# Patient Record
Sex: Male | Born: 1995 | Hispanic: No | Marital: Single | Smoking: Never smoker
Health system: Southern US, Community
[De-identification: ages and names within clinical notes are randomized; demographics above are authoritative.]

## PROBLEM LIST (undated history)

## (undated) DIAGNOSIS — Z8669 Personal history of other diseases of the nervous system and sense organs: Secondary | ICD-10-CM

## (undated) DIAGNOSIS — F988 Other specified behavioral and emotional disorders with onset usually occurring in childhood and adolescence: Secondary | ICD-10-CM

## (undated) DIAGNOSIS — R4681 Obsessive-compulsive behavior: Secondary | ICD-10-CM

## (undated) DIAGNOSIS — E669 Obesity, unspecified: Secondary | ICD-10-CM

## (undated) DIAGNOSIS — F840 Autistic disorder: Secondary | ICD-10-CM

## (undated) DIAGNOSIS — F411 Generalized anxiety disorder: Secondary | ICD-10-CM

## (undated) DIAGNOSIS — G479 Sleep disorder, unspecified: Secondary | ICD-10-CM

## (undated) HISTORY — DX: Generalized anxiety disorder: F41.1

## (undated) HISTORY — DX: Sleep disorder, unspecified: G47.9

## (undated) HISTORY — DX: Obsessive-compulsive behavior: R46.81

## (undated) HISTORY — DX: Obesity, unspecified: E66.9

## (undated) HISTORY — DX: Autistic disorder: F84.0

## (undated) HISTORY — PX: TYMPANOSTOMY TUBE PLACEMENT: SHX32

## (undated) HISTORY — DX: Other specified behavioral and emotional disorders with onset usually occurring in childhood and adolescence: F98.8

## (undated) HISTORY — DX: Personal history of other diseases of the nervous system and sense organs: Z86.69

---
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
## 2013-01-04 ENCOUNTER — Other Ambulatory Visit: Payer: Self-pay | Admitting: *Deleted

## 2013-01-04 MED ORDER — AMPHETAMINE-DEXTROAMPHETAMINE 30 MG PO TABS: 30.0000 mg | ORAL_TABLET | Freq: Three times a day (TID) | ORAL | Status: DC

## 2013-01-17 ENCOUNTER — Telehealth: Payer: Self-pay | Admitting: *Deleted

## 2013-01-17 NOTE — Telephone Encounter (Signed)
Ok.  Sounds good.

## 2013-01-17 NOTE — Telephone Encounter (Signed)
Pt is going out of town & will run out of his Adderall while he is gone so I gave a verbal approval for this to be filled 5 days early.  I also told the pharmacist to let the mother know again that no more refills will be given without an appt with a new physician since Thurmond Butts is no longer here.

## 2013-01-30 ENCOUNTER — Other Ambulatory Visit: Payer: Self-pay

## 2013-01-30 MED ORDER — AMPHETAMINE-DEXTROAMPHETAMINE 30 MG PO TABS: 30.0000 mg | ORAL_TABLET | Freq: Three times a day (TID) | ORAL | Status: DC

## 2013-01-31 ENCOUNTER — Other Ambulatory Visit: Payer: Self-pay | Admitting: Family Medicine

## 2013-02-01 ENCOUNTER — Other Ambulatory Visit: Payer: Self-pay | Admitting: *Deleted

## 2013-02-01 MED ORDER — CLONIDINE HCL 0.2 MG PO TABS: 0.2000 mg | ORAL_TABLET | Freq: Every day | ORAL | Status: DC

## 2013-02-08 ENCOUNTER — Ambulatory Visit: Payer: BC Managed Care – PPO | Admitting: Physician Assistant

## 2013-02-22 ENCOUNTER — Encounter: Payer: Self-pay | Admitting: Physician Assistant

## 2013-02-22 ENCOUNTER — Ambulatory Visit (INDEPENDENT_AMBULATORY_CARE_PROVIDER_SITE_OTHER): Payer: BC Managed Care – PPO | Admitting: Physician Assistant

## 2013-02-22 VITALS — BP 131/76 | HR 85 | Wt 268.0 lb

## 2013-02-22 DIAGNOSIS — G4700 Insomnia, unspecified: Secondary | ICD-10-CM

## 2013-02-22 DIAGNOSIS — F909 Attention-deficit hyperactivity disorder, unspecified type: Secondary | ICD-10-CM

## 2013-02-22 DIAGNOSIS — F840 Autistic disorder: Secondary | ICD-10-CM

## 2013-02-22 MED ORDER — AMPHETAMINE-DEXTROAMPHETAMINE 30 MG PO TABS: ORAL_TABLET | ORAL | Status: DC

## 2013-02-22 MED ORDER — CLONIDINE HCL 0.2 MG PO TABS: 0.2000 mg | ORAL_TABLET | Freq: Every day | ORAL | Status: DC

## 2013-02-22 NOTE — Patient Instructions (Addendum)
Follow up in 3 months

## 2013-02-23 ENCOUNTER — Telehealth: Payer: Self-pay

## 2013-02-23 NOTE — Telephone Encounter (Signed)
[facility] wants to confirm the dosing on his clonidine. Should it be take one 0.2 mg tablet at bedtime or take four 0.2 mg tablets at bedtime?

## 2013-02-23 NOTE — Progress Notes (Signed)
  Subjective:    Patient ID: Cody Harvey, male    DOB: 09-Sep-1996, 17 y.o.   MRN: [id number]  HPI Patient presents to the clinic with his father for ADHD and Insomina. Pt does have autism but is doing very well. He is currently doing online school and making A's and B's.  He is very stable on dose of adderall. He does have sleep issues that may be in part a side effect from meds but has been on clonadine and adderall for over a year together. He takes 1-4 tabs of clonadine at night to help him sleep. Denies any CP, palpitations, SOB, anxiety. No complaints or concerns today.   Review of Systems     Objective:   Physical Exam  Constitutional: He is oriented to person, place, and time. He appears well-developed and well-nourished.  HENT:  Head: Normocephalic and atraumatic.  Cardiovascular: Normal rate, regular rhythm and normal heart sounds.   Neurological: He is alert and oriented to person, place, and time.  Skin: Skin is warm and dry.  Psychiatric: He has a normal mood and affect. His behavior is normal.          Assessment & Plan:  insomina- refilled clonadine. Encouraged good bedtime routine. No TV in room. Calming activities at night.  ADHD- printed 3 months of adderall rx. Follow up in 3 months.

## 2013-02-23 NOTE — Telephone Encounter (Signed)
Up to 4 at bedtime was the dose he has been on in the past.

## 2013-02-24 NOTE — Telephone Encounter (Signed)
[facility] advised.

## 2013-05-26 ENCOUNTER — Encounter: Payer: Self-pay | Admitting: Physician Assistant

## 2013-05-26 ENCOUNTER — Ambulatory Visit (INDEPENDENT_AMBULATORY_CARE_PROVIDER_SITE_OTHER): Payer: BC Managed Care – PPO | Admitting: Physician Assistant

## 2013-05-26 VITALS — BP 122/72 | HR 79 | Temp 98.0°F | Wt 269.0 lb

## 2013-05-26 DIAGNOSIS — J209 Acute bronchitis, unspecified: Secondary | ICD-10-CM

## 2013-05-26 MED ORDER — HYDROCOD POLST-CHLORPHEN POLST 10-8 MG/5ML PO LQCR: 5.0000 mL | Freq: Two times a day (BID) | ORAL | Status: DC | PRN

## 2013-05-26 MED ORDER — AZITHROMYCIN 250 MG PO TABS: ORAL_TABLET | ORAL | Status: DC

## 2013-05-26 NOTE — Patient Instructions (Signed)

## 2013-05-26 NOTE — Progress Notes (Signed)
  Subjective:    Patient ID: Cody Harvey, male    DOB: May 23, 1996, 17 y.o.   MRN: [id number]  HPI Patient is 17 yo male who is autistic and presents with his mother to clinic. He has had a productive cough that has been worsening for the last week. Cough is worse in the morning and at night. Denies fever, chills, sinus pressure, ear pain, or ST. He does not have history of asthma. He has been taking delsym and helps some. Sputum is green. He does feel like his chest is starting to feel tight. Pt wants to feel better for all his graduation functions this weekend. He would like cough syrup given by Dr. Thurmond Butts.    Review of Systems     Objective:   Physical Exam  Constitutional: He is oriented to person, place, and time. He appears well-developed and well-nourished.  HENT:  Head: Normocephalic and atraumatic.  Right Ear: External ear normal.  Left Ear: External ear normal.  Nose: Nose normal.  Mouth/Throat: Oropharynx is clear and moist.  Eyes: Conjunctivae are normal.  Neck: Normal range of motion. Neck supple.  Cardiovascular: Normal rate, regular rhythm and normal heart sounds.   Pulmonary/Chest: Effort normal.  Mild wheezing in both lungs.  Lymphadenopathy:    He has no cervical adenopathy.  Neurological: He is alert and oriented to person, place, and time.  Skin: Skin is warm and dry.  Psychiatric: He has a normal mood and affect. His behavior is normal.          Assessment & Plan:  Bronchitis- Pt has a lot of graduation parties and functions this week. Will treat with zpak and tussinonex which he has used before. Continue to use delsym during the day. Mucinex could also help. Call if not improving.

## 2013-05-31 ENCOUNTER — Ambulatory Visit (INDEPENDENT_AMBULATORY_CARE_PROVIDER_SITE_OTHER): Payer: BC Managed Care – PPO | Admitting: Physician Assistant

## 2013-05-31 ENCOUNTER — Encounter: Payer: Self-pay | Admitting: Physician Assistant

## 2013-05-31 VITALS — BP 116/75 | HR 89 | Wt 271.0 lb

## 2013-05-31 DIAGNOSIS — E669 Obesity, unspecified: Secondary | ICD-10-CM

## 2013-05-31 DIAGNOSIS — Z79899 Other long term (current) drug therapy: Secondary | ICD-10-CM

## 2013-05-31 DIAGNOSIS — F909 Attention-deficit hyperactivity disorder, unspecified type: Secondary | ICD-10-CM

## 2013-05-31 MED ORDER — AMPHETAMINE-DEXTROAMPHETAMINE 30 MG PO TABS: ORAL_TABLET | ORAL | Status: DC

## 2013-06-01 LAB — COMPREHENSIVE METABOLIC PANEL
ALT: 17 U/L (ref 0–53)
AST: 18 U/L (ref 0–37)
Alkaline Phosphatase: 69 U/L (ref 39–117)
CO2: 29 mEq/L (ref 19–32)
Creat: 0.75 mg/dL (ref 0.50–1.35)
Total Bilirubin: 0.5 mg/dL (ref 0.3–1.2)

## 2013-06-01 NOTE — Progress Notes (Signed)
  Subjective:    Patient ID: Cody Harvey, male    DOB: [birth year], 17 y.o.   MRN: [id number]  HPI Patient presents to the clinic with his father to follow up on ADHD. He is doing great today. He graduated from high school last weekend. He made all A's this year. He is considering all of his college options. Denies any CP, palpitations. He has chronic insomnia but well controlled currently on clonidine at night. No concerns today.    Review of Systems     Objective:   Physical Exam  Constitutional: He is oriented to person, place, and time. He appears well-developed and well-nourished.  obese  HENT:  Head: Normocephalic and atraumatic.  Cardiovascular: Normal rate, regular rhythm and normal heart sounds.   Pulmonary/Chest: Effort normal and breath sounds normal. He has no wheezes.  Neurological: He is alert and oriented to person, place, and time.  Skin: Skin is warm and dry.  Psychiatric: He has a normal mood and affect. His behavior is normal.          Assessment & Plan:  ADHD- Refilled meds for 4 months. CMP checked today.  Obesity- encouraged pt to stay active to combat weight gain. Discussed getting outside daily and importance of cardio.

## 2013-07-03 ENCOUNTER — Other Ambulatory Visit: Payer: Self-pay | Admitting: *Deleted

## 2013-07-03 MED ORDER — CLONIDINE HCL 0.2 MG PO TABS: 0.2000 mg | ORAL_TABLET | Freq: Every day | ORAL | Status: DC

## 2013-08-09 IMAGING — CR DG WRIST COMPLETE 3+V*L*
2 series · 2 of 2 positions shown · non-contrast
Comparison: None

CLINICAL DATA: The wrist pain.  No known injury.

LEFT WRIST - COMPLETE 3+ VIEW

[view not recorded (1 of 2)]
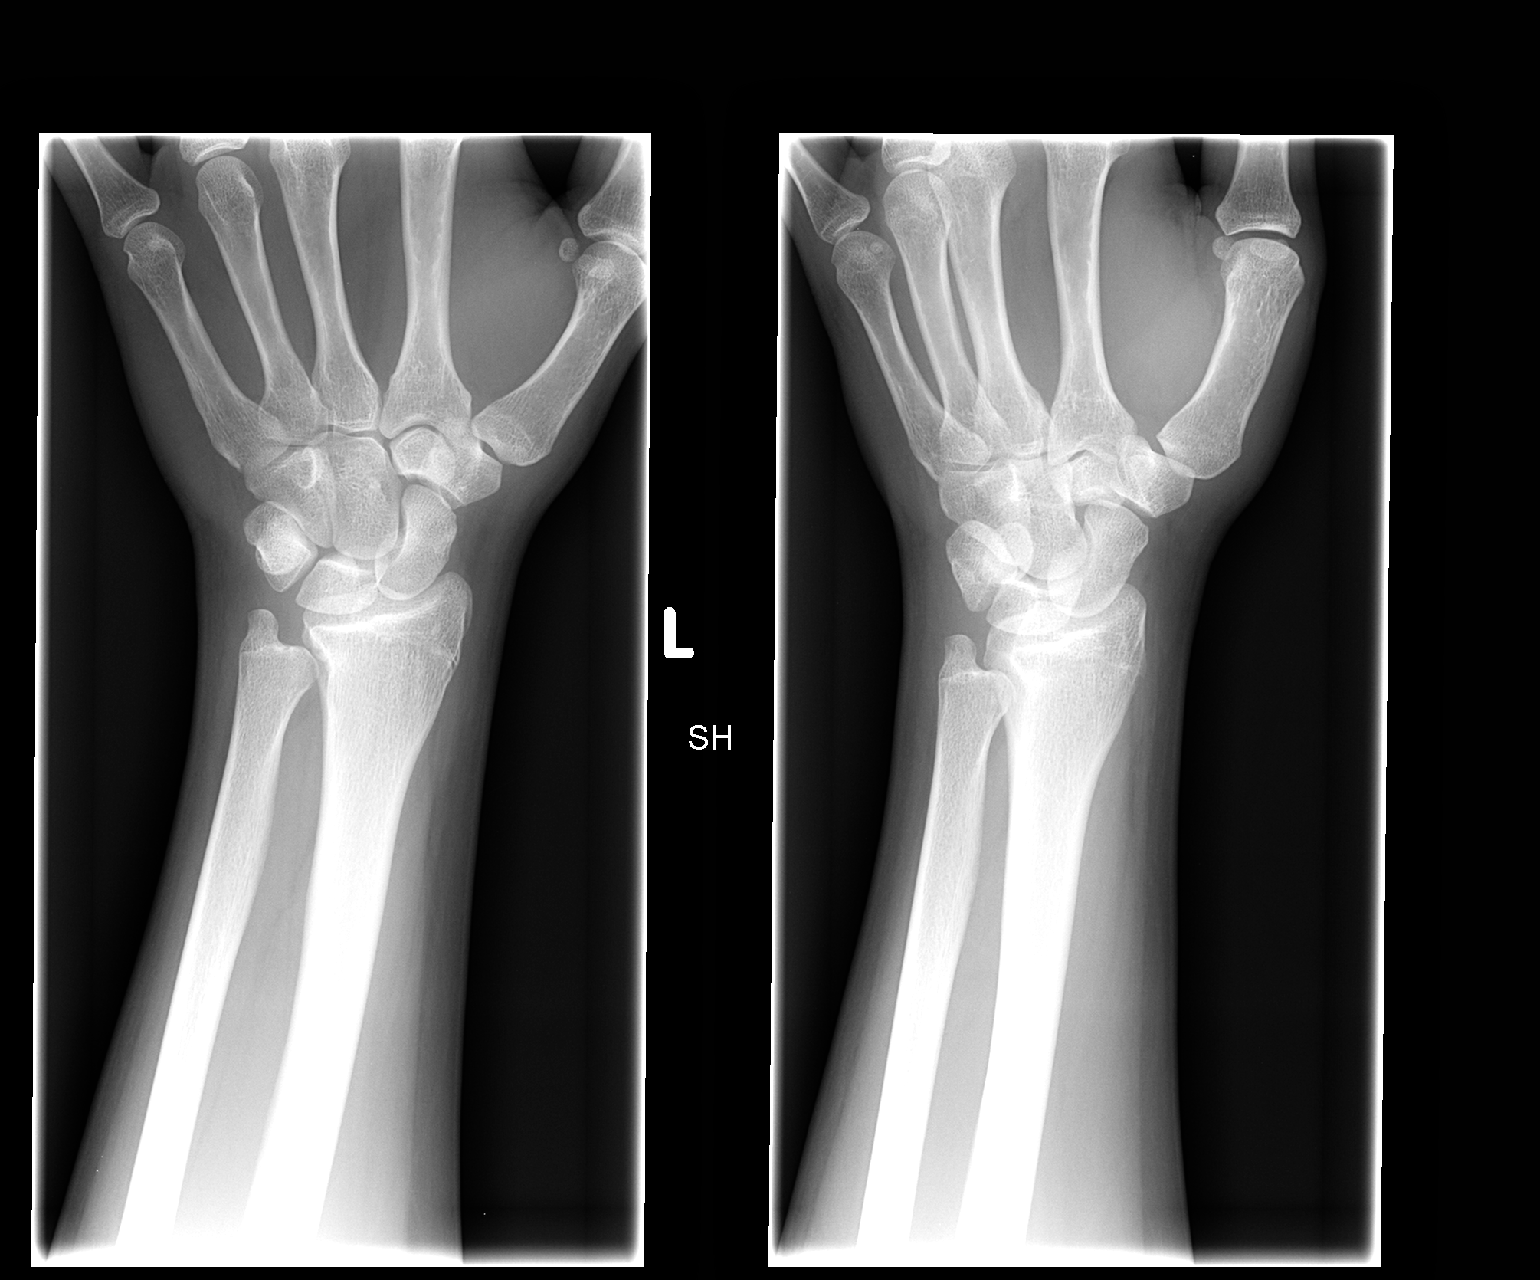

[view not recorded (2 of 2)]
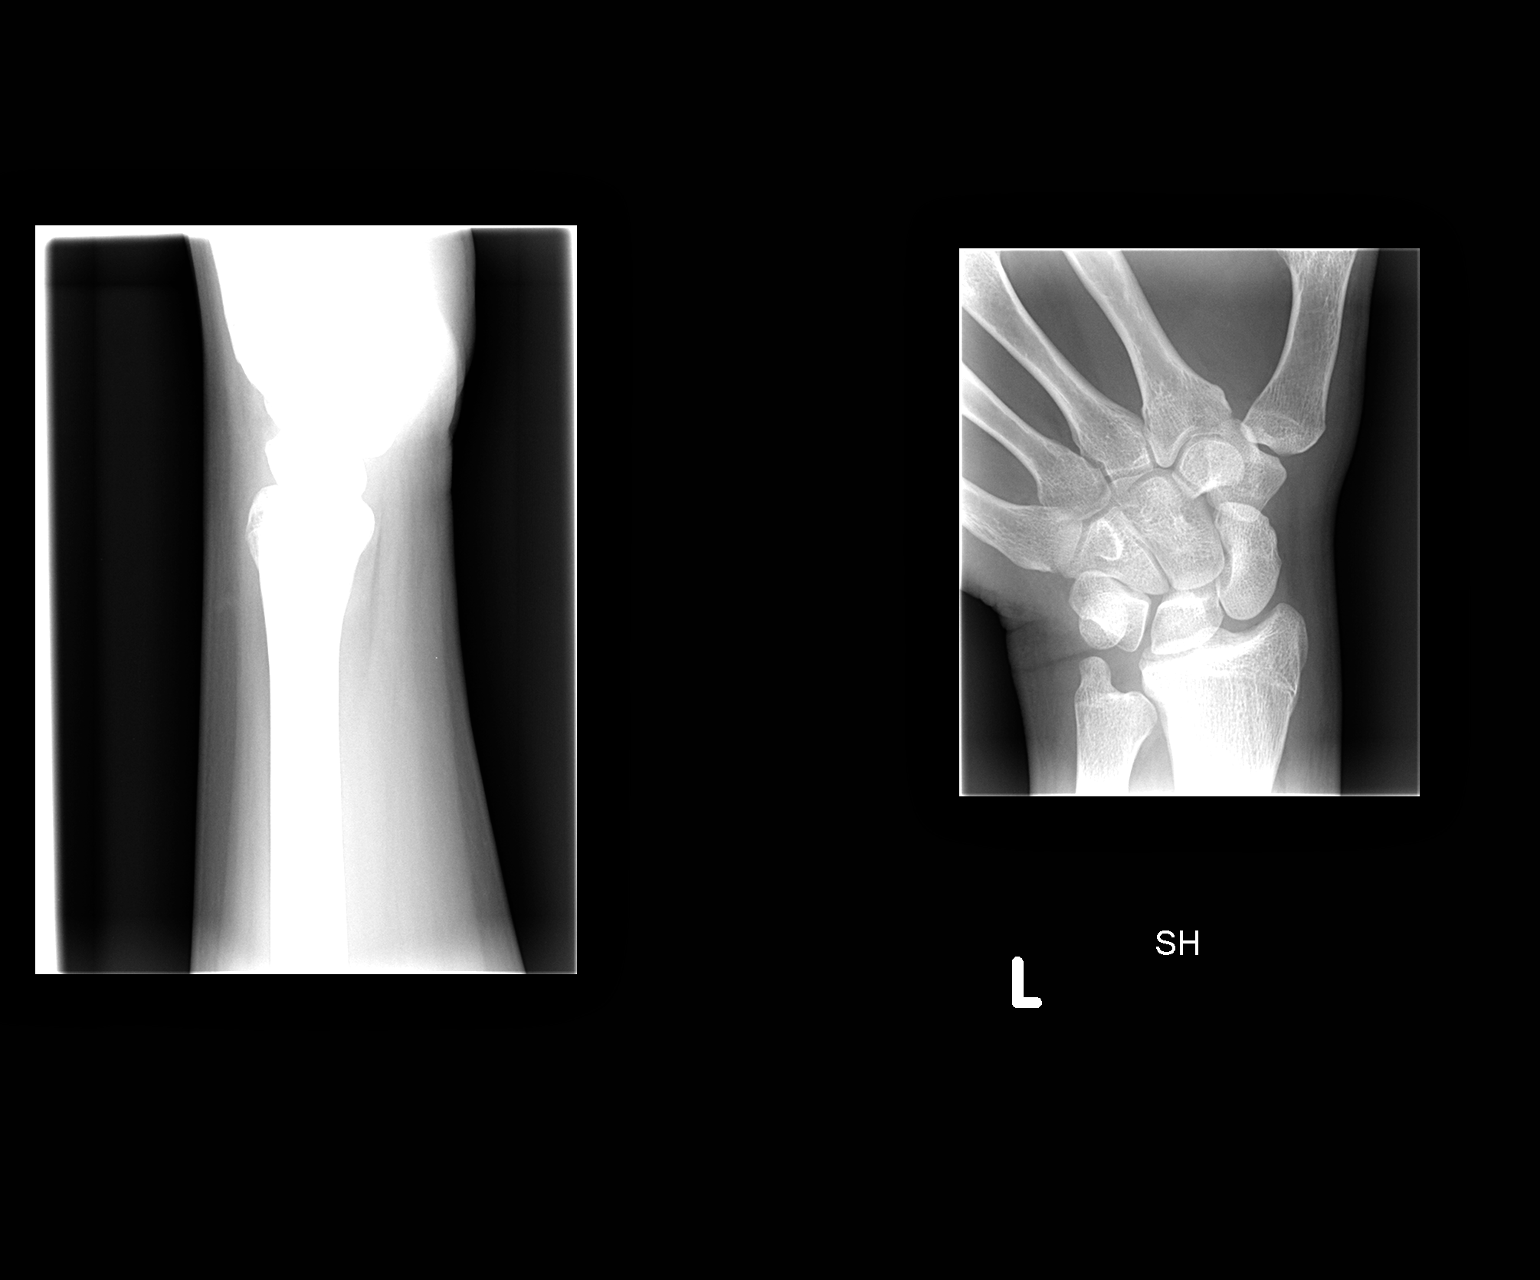

[2 of 2 positions shown; findings below may reference images not displayed]

FINDINGS: The joint spaces are maintained.  Negative ulnar variance
is noted.  Near complete fusion of the radial physis.  No acute
fracture.  No bone lesions or erosions.
IMPRESSION: 1.  Negative ulnar variance.
2.  No acute bony findings.

## 2013-10-16 ENCOUNTER — Other Ambulatory Visit: Payer: Self-pay | Admitting: Physician Assistant

## 2013-11-15 ENCOUNTER — Other Ambulatory Visit: Payer: Self-pay | Admitting: Physician Assistant

## 2013-11-22 ENCOUNTER — Ambulatory Visit (INDEPENDENT_AMBULATORY_CARE_PROVIDER_SITE_OTHER): Payer: BC Managed Care – PPO | Admitting: Physician Assistant

## 2013-11-22 ENCOUNTER — Encounter: Payer: Self-pay | Admitting: Physician Assistant

## 2013-11-22 VITALS — BP 124/71 | HR 93 | Wt 268.0 lb

## 2013-11-22 DIAGNOSIS — F840 Autistic disorder: Secondary | ICD-10-CM

## 2013-11-22 DIAGNOSIS — G4700 Insomnia, unspecified: Secondary | ICD-10-CM

## 2013-11-22 DIAGNOSIS — Z23 Encounter for immunization: Secondary | ICD-10-CM

## 2013-11-22 DIAGNOSIS — F909 Attention-deficit hyperactivity disorder, unspecified type: Secondary | ICD-10-CM

## 2013-11-22 MED ORDER — AMPHETAMINE-DEXTROAMPHETAMINE 30 MG PO TABS: ORAL_TABLET | ORAL | Status: DC

## 2013-11-22 MED ORDER — CLONIDINE HCL 0.2 MG PO TABS: ORAL_TABLET | ORAL | Status: DC

## 2013-11-22 NOTE — Progress Notes (Signed)
   Subjective:    Patient ID: Cody Harvey, male    DOB: 1995-12-19, 17 y.o.   MRN: [id number]  HPI Pt presents to the clinic for follow up and medication refill.   ADHD- Pt taking college classes online. He is doing well with dose. Denies any palpitations or anorexia. He has chronic insomia and takes clonadine every night. No problems today.    Review of Systems     Objective:   Physical Exam  Constitutional: He is oriented to person, place, and time. He appears well-developed and well-nourished.  Obese.   HENT:  Head: Normocephalic and atraumatic.  Cardiovascular: Normal rate, regular rhythm and normal heart sounds.   Pulmonary/Chest: Effort normal and breath sounds normal.  Neurological: He is alert and oriented to person, place, and time.  Skin: Skin is warm and dry.  Psychiatric: He has a normal mood and affect. His behavior is normal.          Assessment & Plan:  ADHd-refilled adderall for 3 months.   Insomnia-refilled clonadine for 3 months.   Autism- unchanged and coping well.   Flu shot given without complications.

## 2014-01-23 ENCOUNTER — Other Ambulatory Visit: Payer: Self-pay | Admitting: Physician Assistant

## 2014-02-17 ENCOUNTER — Other Ambulatory Visit: Payer: Self-pay | Admitting: Physician Assistant

## 2014-03-20 ENCOUNTER — Telehealth: Payer: Self-pay | Admitting: *Deleted

## 2014-03-20 ENCOUNTER — Other Ambulatory Visit: Payer: Self-pay | Admitting: Physician Assistant

## 2014-03-20 MED ORDER — CLONIDINE HCL 0.2 MG PO TABS: ORAL_TABLET | ORAL | Status: DC

## 2014-03-20 NOTE — Telephone Encounter (Signed)
Mom left vm stating that Cody Harvey's clonadine had been denied.  She stated that she was under the impression that he has been seen every 6 months for that not 3 months like the adderall.  She states that he only has one pill left & wants to know if you need him to come in tomorrow.  Please advise.

## 2014-03-20 NOTE — Telephone Encounter (Signed)
Refill sent.  Pt's mom notified.

## 2014-03-20 NOTE — Telephone Encounter (Signed)
Amber please give refill plus one for clonadine. Follow up in June.

## 2014-04-17 ENCOUNTER — Ambulatory Visit (INDEPENDENT_AMBULATORY_CARE_PROVIDER_SITE_OTHER): Payer: BC Managed Care – PPO | Admitting: Physician Assistant

## 2014-04-17 ENCOUNTER — Encounter: Payer: Self-pay | Admitting: Physician Assistant

## 2014-04-17 VITALS — BP 100/62 | HR 83 | Wt 264.0 lb

## 2014-04-17 DIAGNOSIS — G4700 Insomnia, unspecified: Secondary | ICD-10-CM

## 2014-04-17 DIAGNOSIS — F909 Attention-deficit hyperactivity disorder, unspecified type: Secondary | ICD-10-CM

## 2014-04-17 DIAGNOSIS — E663 Overweight: Secondary | ICD-10-CM

## 2014-04-17 DIAGNOSIS — F840 Autistic disorder: Secondary | ICD-10-CM

## 2014-04-17 MED ORDER — AMPHETAMINE-DEXTROAMPHETAMINE 30 MG PO TABS: ORAL_TABLET | ORAL | Status: DC

## 2014-04-17 MED ORDER — CLONIDINE HCL 0.2 MG PO TABS: ORAL_TABLET | ORAL | Status: DC

## 2014-04-17 NOTE — Progress Notes (Signed)
   Subjective:    Patient ID: Cody Harvey, male    DOB: 23-Aug-1996, 18 y.o.   MRN: [id number]  HPI Patient is a 18 year old male who presents to the clinic to followup on ADHD as well as insomnia. He is on Adderall and clonidine. He is doing well on this medication and has been for a few years now. He has no complaints, problems or concerns. He has been diagnosed with autism. He stays at home with his mother and father. She is currently doing some home on college classes which are very enjoyable.   Review of Systems     Objective:   Physical Exam  Constitutional: He is oriented to person, place, and time. He appears well-developed and well-nourished.  Obesity  HENT:  Head: Normocephalic and atraumatic.  Cardiovascular: Normal rate, regular rhythm and normal heart sounds.   Pulmonary/Chest: Effort normal and breath sounds normal.  Neurological: He is alert and oriented to person, place, and time.  Skin: Skin is dry.  Psychiatric: He has a normal mood and affect. His behavior is normal.          Assessment & Plan:  ADHD-refilled Adderall today. Patient is very controlled and okay for followup in 6 months.  Insomnia-refilled clonidine. Patient is very controlled refill in 6 months.  Obesity-patient kindly encouraged to stay active and maintain a healthy diet. Concerned about his weight and future health problems.  Has been a while since we checked kidney and liver function. CMP was ordered today.

## 2014-08-29 ENCOUNTER — Encounter: Payer: Self-pay | Admitting: Physician Assistant

## 2014-08-29 ENCOUNTER — Ambulatory Visit (INDEPENDENT_AMBULATORY_CARE_PROVIDER_SITE_OTHER): Payer: BLUE CROSS/BLUE SHIELD | Admitting: Physician Assistant

## 2014-08-29 VITALS — BP 131/70 | HR 85 | Ht 73.0 in | Wt 258.0 lb

## 2014-08-29 DIAGNOSIS — F909 Attention-deficit hyperactivity disorder, unspecified type: Secondary | ICD-10-CM | POA: Diagnosis not present

## 2014-08-29 DIAGNOSIS — Z13220 Encounter for screening for lipoid disorders: Secondary | ICD-10-CM

## 2014-08-29 DIAGNOSIS — F900 Attention-deficit hyperactivity disorder, predominantly inattentive type: Secondary | ICD-10-CM

## 2014-08-29 DIAGNOSIS — G4700 Insomnia, unspecified: Secondary | ICD-10-CM

## 2014-08-29 MED ORDER — CLONIDINE HCL 0.2 MG PO TABS: ORAL_TABLET | ORAL | Status: DC

## 2014-08-29 MED ORDER — AMPHETAMINE-DEXTROAMPHETAMINE 30 MG PO TABS: ORAL_TABLET | ORAL | Status: DC

## 2014-08-31 NOTE — Progress Notes (Signed)
   Subjective:    Patient ID: Cody Harvey, male    DOB: 1996/09/03, 18 y.o.   MRN: [id number]  HPI Pt presents to the clinic with mother and sister for medication refill.   ADHD- doing well using adderall as needed. Taking college classes and does not need it every day. No problems or concerns with medication.   Insomnia- controlled for years on clonidine. No problems.    Review of Systems  All other systems reviewed and are negative.      Objective:   Physical Exam  Constitutional: He is oriented to person, place, and time. He appears well-developed and well-nourished.  Obese.   HENT:  Head: Normocephalic and atraumatic.  Cardiovascular: Normal rate, regular rhythm and normal heart sounds.   Pulmonary/Chest: Effort normal and breath sounds normal.  Neurological: He is alert and oriented to person, place, and time.  Psychiatric: He has a normal mood and affect. His behavior is normal.          Assessment & Plan:  ADHD- refilled for 3 months.   Insomnia- refilled for 3 months.   Pt given lipid and cmp to have drawn fasting. Encouraged next visit to be cPE.   Brief discussion on weight.   Pt declined flu shot today. Reports will come back later in season to get.

## 2014-09-21 LAB — COMPLETE METABOLIC PANEL WITH GFR
ALBUMIN: 4.6 g/dL (ref 3.5–5.2)
ALT: 14 U/L (ref 0–53)
AST: 17 U/L (ref 0–37)
Alkaline Phosphatase: 57 U/L (ref 39–117)
BUN: 13 mg/dL (ref 6–23)
CHLORIDE: 99 meq/L (ref 96–112)
CO2: 24 mEq/L (ref 19–32)
Calcium: 9.6 mg/dL (ref 8.4–10.5)
Creat: 0.85 mg/dL (ref 0.50–1.35)
GFR, Est African American: >89 mL/min
GFR, Est Non African American: >89 mL/min
Glucose, Bld: 75 mg/dL (ref 70–99)
POTASSIUM: 4.1 meq/L (ref 3.5–5.3)
Sodium: 136 mEq/L (ref 135–145)
TOTAL PROTEIN: 7 g/dL (ref 6.0–8.3)
Total Bilirubin: 0.6 mg/dL (ref 0.2–1.2)

## 2014-09-21 LAB — LIPID PANEL
CHOL/HDL RATIO: 4.7 ratio
Cholesterol: 175 mg/dL (ref 0–200)
HDL: 37 mg/dL — ABNORMAL LOW (ref >39)
LDL Cholesterol: 115 mg/dL — ABNORMAL HIGH (ref 0–99)
TRIGLYCERIDES: 114 mg/dL (ref <150)
VLDL: 23 mg/dL (ref 0–40)

## 2015-01-07 ENCOUNTER — Telehealth: Payer: Self-pay

## 2015-01-07 NOTE — Telephone Encounter (Signed)
Ok for 1 month they have been well controlled for many months.

## 2015-01-07 NOTE — Telephone Encounter (Signed)
Mom would like to have a month refill on adderall and she will bring him in for a follow up in about a month.

## 2015-01-08 MED ORDER — AMPHETAMINE-DEXTROAMPHETAMINE 30 MG PO TABS
ORAL_TABLET | ORAL | Status: DC
Start: 2015-01-08 — End: 2015-03-04

## 2015-01-08 NOTE — Telephone Encounter (Signed)
Mom is aware to pick up refill at front desk.

## 2015-03-04 ENCOUNTER — Other Ambulatory Visit: Payer: Self-pay | Admitting: *Deleted

## 2015-03-04 MED ORDER — AMPHETAMINE-DEXTROAMPHETAMINE 30 MG PO TABS: ORAL_TABLET | ORAL | Status: DC

## 2015-03-06 ENCOUNTER — Other Ambulatory Visit: Payer: Self-pay | Admitting: Physician Assistant

## 2015-03-27 ENCOUNTER — Ambulatory Visit (INDEPENDENT_AMBULATORY_CARE_PROVIDER_SITE_OTHER): Payer: BLUE CROSS/BLUE SHIELD | Admitting: Physician Assistant

## 2015-03-27 ENCOUNTER — Encounter: Payer: Self-pay | Admitting: Physician Assistant

## 2015-03-27 VITALS — BP 118/69 | HR 87 | Wt 265.0 lb

## 2015-03-27 DIAGNOSIS — G4700 Insomnia, unspecified: Secondary | ICD-10-CM

## 2015-03-27 DIAGNOSIS — E669 Obesity, unspecified: Secondary | ICD-10-CM

## 2015-03-27 DIAGNOSIS — F900 Attention-deficit hyperactivity disorder, predominantly inattentive type: Secondary | ICD-10-CM | POA: Diagnosis not present

## 2015-03-27 DIAGNOSIS — F840 Autistic disorder: Secondary | ICD-10-CM | POA: Diagnosis not present

## 2015-03-27 DIAGNOSIS — H6123 Impacted cerumen, bilateral: Secondary | ICD-10-CM

## 2015-03-27 MED ORDER — AMPHETAMINE-DEXTROAMPHETAMINE 30 MG PO TABS: ORAL_TABLET | ORAL | Status: DC

## 2015-03-27 MED ORDER — CLONIDINE HCL 0.2 MG PO TABS: ORAL_TABLET | ORAL | Status: DC

## 2015-03-28 DIAGNOSIS — E669 Obesity, unspecified: Secondary | ICD-10-CM | POA: Insufficient documentation

## 2015-03-28 NOTE — Progress Notes (Signed)
   Subjective:    Patient ID: Cody Harvey, male    DOB: October 06, 1996, 19 y.o.   MRN: [id number]  HPI  Pt presents to the clinic for medication refill.   ADHD- doing well on medication. Does not take everyday. He is currently taking college classes. No side effects. Tolerated for some time.   Insomnia- doing well as long as uses medication. No SE or issues.     Review of Systems  All other systems reviewed and are negative.      Objective:   Physical Exam  Constitutional: He is oriented to person, place, and time. He appears well-developed and well-nourished.  Obese.   HENT:  Head: Normocephalic and atraumatic.  Bilateral cerumen impacted.  TM's clear after irrigation.   Eyes: Conjunctivae are normal. Right eye exhibits no discharge. Left eye exhibits no discharge.  Cardiovascular: Normal rate, regular rhythm and normal heart sounds.   Pulmonary/Chest: Effort normal and breath sounds normal. He has no wheezes.  Neurological: He is alert and oriented to person, place, and time.  Psychiatric: He has a normal mood and affect. His behavior is normal.          Assessment & Plan:  ADHD- refilled for 4 months to get them until I get back from maternity leave.   Insomnia- 6 months refilled.   Cerumen impaction- ear irrigated today.   Obesity- dicussed regular exercise, diet control, and weight control. Last LDL was 09/2014 and looked good.

## 2015-09-18 ENCOUNTER — Other Ambulatory Visit: Payer: Self-pay | Admitting: Physician Assistant

## 2015-10-09 ENCOUNTER — Other Ambulatory Visit: Payer: Self-pay | Admitting: *Deleted

## 2015-10-09 MED ORDER — AMPHETAMINE-DEXTROAMPHETAMINE 30 MG PO TABS: ORAL_TABLET | ORAL | Status: DC

## 2015-10-09 MED ORDER — CLONIDINE HCL 0.2 MG PO TABS: 0.8000 mg | ORAL_TABLET | Freq: Every day | ORAL | Status: DC

## 2015-10-23 ENCOUNTER — Ambulatory Visit: Payer: BLUE CROSS/BLUE SHIELD | Admitting: Physician Assistant

## 2015-11-05 ENCOUNTER — Ambulatory Visit: Payer: BLUE CROSS/BLUE SHIELD | Admitting: Physician Assistant

## 2015-11-06 ENCOUNTER — Encounter: Payer: Self-pay | Admitting: Physician Assistant

## 2015-11-06 ENCOUNTER — Ambulatory Visit (INDEPENDENT_AMBULATORY_CARE_PROVIDER_SITE_OTHER): Payer: BLUE CROSS/BLUE SHIELD | Admitting: Physician Assistant

## 2015-11-06 VITALS — BP 120/57 | HR 92 | Ht 73.0 in | Wt 269.0 lb

## 2015-11-06 DIAGNOSIS — Z23 Encounter for immunization: Secondary | ICD-10-CM

## 2015-11-06 DIAGNOSIS — G4700 Insomnia, unspecified: Secondary | ICD-10-CM | POA: Diagnosis not present

## 2015-11-06 DIAGNOSIS — F900 Attention-deficit hyperactivity disorder, predominantly inattentive type: Secondary | ICD-10-CM | POA: Diagnosis not present

## 2015-11-06 MED ORDER — AMPHETAMINE-DEXTROAMPHETAMINE 30 MG PO TABS: ORAL_TABLET | ORAL | Status: DC

## 2015-11-06 MED ORDER — CLONIDINE HCL 0.2 MG PO TABS: 0.8000 mg | ORAL_TABLET | Freq: Every day | ORAL | Status: DC

## 2015-11-06 NOTE — Progress Notes (Signed)
   Subjective:    Patient ID: Cody Harvey, male    DOB: [birth year], 19 y.o.   MRN: [id number]  HPI Patient is a 19 year old male who presents to the clinic with his mother to get refills on his Adderall for ADHD. He is doing well on this medication with no concerns or complaints. He does not take this medication daily but only when he needs it for school. He is in college but only taking college classes a few days a week.  Insomnia-patient takes up to 4 tablets of clonidine at bedtime. He does not always need Fortabs. This does well works well. He has no concerns or complaints.   Review of Systems  All other systems reviewed and are negative.      Objective:   Physical Exam  Constitutional: He is oriented to person, place, and time. He appears well-developed and well-nourished.  obese  HENT:  Head: Normocephalic and atraumatic.  Cardiovascular: Normal rate, regular rhythm and normal heart sounds.   Pulmonary/Chest: Effort normal and breath sounds normal. He has no wheezes.  Neurological: He is alert and oriented to person, place, and time.  Skin: Skin is dry.  Psychiatric: He has a normal mood and affect. His behavior is normal.          Assessment & Plan:  ADHD- patient request 90 day prescription for Adderall. Was printed and Dr. Eppie Gibsonmetheny signed for. Follow up in 3 months.   Insomnia- refilled for 6 months.

## 2016-01-07 ENCOUNTER — Other Ambulatory Visit: Payer: Self-pay

## 2016-01-07 MED ORDER — AMPHETAMINE-DEXTROAMPHETAMINE 30 MG PO TABS: ORAL_TABLET | ORAL | Status: DC

## 2016-01-15 ENCOUNTER — Other Ambulatory Visit: Payer: Self-pay | Admitting: *Deleted

## 2016-01-15 MED ORDER — CLONIDINE HCL 0.2 MG PO TABS: 0.8000 mg | ORAL_TABLET | Freq: Every day | ORAL | Status: DC

## 2016-02-10 ENCOUNTER — Other Ambulatory Visit: Payer: Self-pay

## 2016-02-10 MED ORDER — AMPHETAMINE-DEXTROAMPHETAMINE 30 MG PO TABS: ORAL_TABLET | ORAL | Status: DC

## 2016-02-14 ENCOUNTER — Telehealth: Payer: Self-pay

## 2016-02-14 NOTE — Telephone Encounter (Signed)
PA required for Amphetamine-Dextroamphetamine. Approved

## 2016-03-25 ENCOUNTER — Ambulatory Visit (INDEPENDENT_AMBULATORY_CARE_PROVIDER_SITE_OTHER): Payer: BLUE CROSS/BLUE SHIELD | Admitting: Physician Assistant

## 2016-03-25 ENCOUNTER — Encounter: Payer: Self-pay | Admitting: Physician Assistant

## 2016-03-25 VITALS — BP 118/53 | HR 80 | Ht 73.0 in | Wt 273.0 lb

## 2016-03-25 DIAGNOSIS — G4700 Insomnia, unspecified: Secondary | ICD-10-CM | POA: Diagnosis not present

## 2016-03-25 DIAGNOSIS — F900 Attention-deficit hyperactivity disorder, predominantly inattentive type: Secondary | ICD-10-CM

## 2016-03-25 MED ORDER — AMPHETAMINE-DEXTROAMPHETAMINE 30 MG PO TABS: ORAL_TABLET | ORAL | Status: DC

## 2016-03-25 MED ORDER — CLONIDINE HCL 0.2 MG PO TABS: 0.8000 mg | ORAL_TABLET | Freq: Every day | ORAL | Status: DC

## 2016-03-25 NOTE — Progress Notes (Signed)
   Subjective:    Patient ID: Cody Harvey, male    DOB: 12-22-1995, 20 y.o.   MRN: [id number]  HPI Pt is a 20 yo male who presents to the clinic with mother for refill on Adderall and Clonidine. He is doing well. No problems or concerns.    Review of Systems  All other systems reviewed and are negative.      Objective:   Physical Exam  Constitutional: He is oriented to person, place, and time. He appears well-developed and well-nourished.  Obese.  HENT:  Head: Normocephalic and atraumatic.  Cardiovascular: Normal rate, regular rhythm and normal heart sounds.   Pulmonary/Chest: Effort normal and breath sounds normal.  Neurological: He is alert and oriented to person, place, and time.  Psychiatric: He has a normal mood and affect. His behavior is normal.          Assessment & Plan:  ADHD- refilled for 3 months.   Insomnia- refilled for 3 months.   Obese- recent weight gain. Encouraged patient to start walking daily and watch diet.

## 2016-03-30 ENCOUNTER — Telehealth: Payer: Self-pay | Admitting: *Deleted

## 2016-03-30 ENCOUNTER — Other Ambulatory Visit: Payer: Self-pay | Admitting: *Deleted

## 2016-03-30 MED ORDER — HYDROCOD POLST-CPM POLST ER 10-8 MG/5ML PO SUER
5.0000 mL | Freq: Two times a day (BID) | ORAL | Status: DC | PRN
Start: 2016-03-30 — End: 2016-07-24

## 2016-03-30 MED ORDER — AZITHROMYCIN 250 MG PO TABS: ORAL_TABLET | ORAL | Status: AC

## 2016-03-30 NOTE — Telephone Encounter (Signed)
Mom notified.

## 2016-03-30 NOTE — Telephone Encounter (Signed)
Pt's mom left vm this morning stating that he now has what sister had last week and wanted to know if you'd be willing to send something in for him.  Please advise.

## 2016-03-30 NOTE — Telephone Encounter (Signed)
Ok to send zpak 2 po now and then one po for 4 days.

## 2016-07-24 ENCOUNTER — Other Ambulatory Visit: Payer: Self-pay | Admitting: Family Medicine

## 2016-07-24 MED ORDER — AMPHETAMINE-DEXTROAMPHETAMINE 30 MG PO TABS: ORAL_TABLET | ORAL | 0 refills | Status: DC

## 2016-07-24 NOTE — Progress Notes (Signed)
Patient's mother called requesting a refill on Adderall. He was supposed to come in for an office visit in July but did not. He is getting ready to leave town with his father and mom is concerned. We'll go ahead and fill a 15 day supply today. He will be required to follow-up with his PCP before any further refills.  Nani Gasseratherine Brandon Wiechman, MD

## 2016-08-07 ENCOUNTER — Ambulatory Visit: Payer: BLUE CROSS/BLUE SHIELD | Admitting: Physician Assistant

## 2016-08-27 ENCOUNTER — Other Ambulatory Visit: Payer: Self-pay | Admitting: *Deleted

## 2016-08-27 MED ORDER — AMPHETAMINE-DEXTROAMPHETAMINE 30 MG PO TABS: ORAL_TABLET | ORAL | 0 refills | Status: DC

## 2016-09-18 ENCOUNTER — Ambulatory Visit (INDEPENDENT_AMBULATORY_CARE_PROVIDER_SITE_OTHER): Payer: BLUE CROSS/BLUE SHIELD | Admitting: Physician Assistant

## 2016-09-18 ENCOUNTER — Encounter: Payer: Self-pay | Admitting: Physician Assistant

## 2016-09-18 VITALS — BP 113/64 | HR 135 | Wt 271.0 lb

## 2016-09-18 DIAGNOSIS — F900 Attention-deficit hyperactivity disorder, predominantly inattentive type: Secondary | ICD-10-CM

## 2016-09-18 DIAGNOSIS — F840 Autistic disorder: Secondary | ICD-10-CM | POA: Diagnosis not present

## 2016-09-18 DIAGNOSIS — F5101 Primary insomnia: Secondary | ICD-10-CM | POA: Diagnosis not present

## 2016-09-18 MED ORDER — AMPHETAMINE-DEXTROAMPHETAMINE 30 MG PO TABS: ORAL_TABLET | ORAL | 0 refills | Status: DC

## 2016-09-18 MED ORDER — CLONIDINE HCL 0.2 MG PO TABS: 0.8000 mg | ORAL_TABLET | Freq: Every day | ORAL | 1 refills | Status: DC

## 2016-09-18 NOTE — Progress Notes (Signed)
   Subjective:    Patient ID: Cody Harvey, male    DOB: 02-11-1996, 20 y.o.   MRN: [id number]  HPI Pt is a 20 yo male who presents to the clinic with his mother. Pt is autistic.   He is doing well on his adderall and clonidine. No problems or concerns. He is sleeping well. He continues to take some college classes and trying to get a job.    Review of Systems  All other systems reviewed and are negative.      Objective:   Physical Exam  Constitutional: He is oriented to person, place, and time. He appears well-developed and well-nourished.  HENT:  Head: Normocephalic and atraumatic.  Cardiovascular: Normal rate, regular rhythm and normal heart sounds.   Pulmonary/Chest: Effort normal and breath sounds normal.  Neurological: He is alert and oriented to person, place, and time.  Psychiatric: He has a normal mood and affect. His behavior is normal.          Assessment & Plan:  Active autistic disorder/ADHD- Adderall refilled for 4 months.   Primary insomnia- clonidine refilled for bedtime.

## 2016-10-09 ENCOUNTER — Other Ambulatory Visit: Payer: Self-pay | Admitting: Physician Assistant

## 2016-10-30 ENCOUNTER — Other Ambulatory Visit: Payer: Self-pay | Admitting: *Deleted

## 2016-10-30 MED ORDER — AMPHETAMINE-DEXTROAMPHETAMINE 30 MG PO TABS: ORAL_TABLET | ORAL | 0 refills | Status: DC

## 2017-03-11 ENCOUNTER — Telehealth: Payer: Self-pay

## 2017-03-11 NOTE — Telephone Encounter (Signed)
Mother is requesting a refill on adderall.  She said he is going away for testing and will be out in 2 days if not refilled.  Please advise.

## 2017-03-11 NOTE — Telephone Encounter (Signed)
No:  needs appt. Was due to come in in Jan.    No refills until comes in for appt.  He hasn't been coming in for regular appt and he will no longer be able to get meds if cannot keep on schedule.    Nani Gasseratherine Dinh Ayotte, MD

## 2017-03-17 NOTE — Telephone Encounter (Signed)
Called and talked to mother on 03/16/17 and given information per MD

## 2017-04-05 ENCOUNTER — Other Ambulatory Visit: Payer: Self-pay | Admitting: Physician Assistant

## 2017-04-06 ENCOUNTER — Ambulatory Visit (INDEPENDENT_AMBULATORY_CARE_PROVIDER_SITE_OTHER): Payer: BLUE CROSS/BLUE SHIELD | Admitting: Physician Assistant

## 2017-04-06 ENCOUNTER — Encounter: Payer: Self-pay | Admitting: Physician Assistant

## 2017-04-06 VITALS — BP 103/67 | HR 72 | Ht 73.0 in | Wt 270.0 lb

## 2017-04-06 DIAGNOSIS — F900 Attention-deficit hyperactivity disorder, predominantly inattentive type: Secondary | ICD-10-CM | POA: Diagnosis not present

## 2017-04-06 DIAGNOSIS — F5101 Primary insomnia: Secondary | ICD-10-CM

## 2017-04-06 MED ORDER — AMPHETAMINE-DEXTROAMPHETAMINE 30 MG PO TABS: ORAL_TABLET | ORAL | 0 refills | Status: DC

## 2017-04-06 MED ORDER — CLONIDINE HCL 0.2 MG PO TABS: 0.8000 mg | ORAL_TABLET | Freq: Every day | ORAL | 1 refills | Status: DC

## 2017-04-06 NOTE — Progress Notes (Signed)
   Subjective:    Patient ID: Cody Harvey, male    DOB: Mar 18, 1996, 21 y.o.   MRN: [id number]  HPI Pt is a 21 yo male who presents to the clinic for 3 month follow up.   ADHD- doing well and has been controlled for years on the same dose. Insomnia controlled with medications. No palpitations, headaches, dizziness or chest pain.   .. Active Ambulatory Problems    Diagnosis Date Noted  . 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 Insomnia 04/03/2010  . OBESITY, CLASS I 04/03/2010  . ADHD (attention deficit hyperactivity disorder) 11/24/2011  . Obesity 03/28/2015   Resolved Ambulatory Problems    Diagnosis Date Noted  . adhd 04/03/2010   Past Medical History:  Diagnosis Date  . ADD (attention deficit disorder)   . Obesity   . Sleep disturbance, unspecified    .Marland Kitchen Family History  Problem Relation Age of Onset  . Fibromyalgia Mother   . Diabetes Father    .Marland Kitchen Social History   Social History  . Marital status: Single    Spouse name: N/A  . Number of children: N/A  . Years of education: N/A   Occupational History  . Not on file.   Social History Main Topics  . Smoking status: Never Smoker  . Smokeless tobacco: Never Used  . Alcohol use No  . Drug use: No  . Sexual activity: Not on file   Other Topics Concern  . Not on file   Social History Narrative  . No narrative on file      Review of Systems  All other systems reviewed and are negative.      Objective:   Physical Exam  Constitutional: He is oriented to person, place, and time. He appears well-developed and well-nourished.  Morbidly obese.   HENT:  Head: Normocephalic and atraumatic.  Cardiovascular: Normal rate, regular rhythm and normal heart sounds.   Pulmonary/Chest: Effort normal and breath sounds normal. He has no wheezes.  Neurological: He is alert and oriented to person, place, and time.  Psychiatric: He has a normal mood and affect. His behavior is normal.          Assessment & Plan:   .[place] was seen today for adhd.  Diagnoses and all orders for this visit:  Attention deficit hyperactivity disorder (ADHD), predominantly inattentive type -     amphetamine-dextroamphetamine (ADDERALL) 30 MG tablet; Take one tablet twice a day.  Primary insomnia -     cloNIDine (CATAPRES) 0.2 MG tablet; Take 4 tablets (0.8 mg total) by mouth at bedtime.  Other orders -     Discontinue: amphetamine-dextroamphetamine (ADDERALL) 30 MG tablet; Take one tablet twice a day. -     Discontinue: amphetamine-dextroamphetamine (ADDERALL) 30 MG tablet; Take one tablet twice a day.   Refilled for 3 months. Vitals look great. Weight is table although obese. Discussed staying active and to keep moving.

## 2017-07-21 ENCOUNTER — Other Ambulatory Visit: Payer: Self-pay

## 2017-07-21 DIAGNOSIS — F900 Attention-deficit hyperactivity disorder, predominantly inattentive type: Secondary | ICD-10-CM

## 2017-07-21 MED ORDER — AMPHETAMINE-DEXTROAMPHETAMINE 30 MG PO TABS: ORAL_TABLET | ORAL | 0 refills | Status: DC

## 2017-07-21 NOTE — Telephone Encounter (Signed)
Pt mother notified that rx is ready for pickup. States that she will make an appt for pt to establish with Clementeen GrahamEvan Corey, MD following a discussion in which he agreed to be pts PCP.

## 2017-07-21 NOTE — Telephone Encounter (Signed)
Patient mother called requested a refill for Adderall for patient, advised her that according to last office visit note patient was due for a follow up appointment July, 8,2018. She was advised that Lesly RubensteinJade was out of the office and that patient could follow up with any doctor here to get refill, she insisted that patient is autistic and that he would not let just anybody touch him.  I told her due to controled substance policy patient needed to be seen.Please advise if you will approve the refill for Adderall.Bjorn Loser. Rhonda Cunningham,CMA

## 2017-07-21 NOTE — Telephone Encounter (Signed)
Okay to refill Adderall 1. Follow-up with Jade in 1 month or less

## 2017-08-18 ENCOUNTER — Other Ambulatory Visit: Payer: Self-pay | Admitting: Physician Assistant

## 2017-08-18 DIAGNOSIS — F900 Attention-deficit hyperactivity disorder, predominantly inattentive type: Secondary | ICD-10-CM

## 2017-08-18 MED ORDER — AMPHETAMINE-DEXTROAMPHETAMINE 30 MG PO TABS: ORAL_TABLET | ORAL | 0 refills | Status: DC

## 2017-08-18 NOTE — Progress Notes (Signed)
Pt will make appt in next 10 days. Gave 10 day supply.

## 2017-08-19 ENCOUNTER — Ambulatory Visit: Payer: BLUE CROSS/BLUE SHIELD | Admitting: Family Medicine

## 2017-09-10 ENCOUNTER — Telehealth: Payer: Self-pay | Admitting: Physician Assistant

## 2017-09-10 NOTE — Telephone Encounter (Signed)
I am ok as long as Dr. Denyse Amass is ok.

## 2017-09-10 NOTE — Telephone Encounter (Signed)
Mom called. She wants to switch from [facility] to Dr C because he has more experience with special needs kids .She is also applying for disability for her son and lawyer suggested  an MD over a PA. Thank you.

## 2017-09-13 ENCOUNTER — Ambulatory Visit (INDEPENDENT_AMBULATORY_CARE_PROVIDER_SITE_OTHER): Payer: BLUE CROSS/BLUE SHIELD | Admitting: Family Medicine

## 2017-09-13 ENCOUNTER — Encounter: Payer: Self-pay | Admitting: Family Medicine

## 2017-09-13 VITALS — BP 116/58 | HR 84 | Wt 266.0 lb

## 2017-09-13 DIAGNOSIS — Z6835 Body mass index (BMI) 35.0-35.9, adult: Secondary | ICD-10-CM | POA: Diagnosis not present

## 2017-09-13 DIAGNOSIS — E6609 Other obesity due to excess calories: Secondary | ICD-10-CM

## 2017-09-13 DIAGNOSIS — F5101 Primary insomnia: Secondary | ICD-10-CM

## 2017-09-13 DIAGNOSIS — Z23 Encounter for immunization: Secondary | ICD-10-CM

## 2017-09-13 DIAGNOSIS — F840 Autistic disorder: Secondary | ICD-10-CM

## 2017-09-13 DIAGNOSIS — F900 Attention-deficit hyperactivity disorder, predominantly inattentive type: Secondary | ICD-10-CM

## 2017-09-13 MED ORDER — CLONIDINE HCL 0.2 MG PO TABS: 0.8000 mg | ORAL_TABLET | Freq: Every day | ORAL | 1 refills | Status: DC

## 2017-09-13 MED ORDER — AMPHETAMINE-DEXTROAMPHETAMINE 30 MG PO TABS: 30.0000 mg | ORAL_TABLET | Freq: Two times a day (BID) | ORAL | 0 refills | Status: DC

## 2017-09-13 MED ORDER — AMPHETAMINE-DEXTROAMPHETAMINE 30 MG PO TABS: ORAL_TABLET | ORAL | 0 refills | Status: DC

## 2017-09-13 NOTE — Patient Instructions (Addendum)
Thank you for coming in today. Get fasting labs in the near future.  Use Debrox drops daily for ear wax.  Recheck with me in 3 months or sooner if needed.    Earwax Buildup, Adult The ears produce a substance called earwax that helps keep bacteria out of the ear and protects the skin in the ear canal. Occasionally, earwax can build up in the ear and cause discomfort or hearing loss. What increases the risk? This condition is more likely to develop in people who:  Are male.  Are elderly.  Naturally produce more earwax.  Clean their ears often with cotton swabs.  Use earplugs often.  Use in-ear headphones often.  Wear hearing aids.  Have narrow ear canals.  Have earwax that is overly thick or sticky.  Have eczema.  Are dehydrated.  Have excess hair in the ear canal.  What are the signs or symptoms? Symptoms of this condition include:  Reduced or muffled hearing.  A feeling of fullness in the ear or feeling that the ear is plugged.  Fluid coming from the ear.  Ear pain.  Ear itch.  Ringing in the ear.  Coughing.  An obvious piece of earwax that can be seen inside the ear canal.  How is this diagnosed? This condition may be diagnosed based on:  Your symptoms.  Your medical history.  An ear exam. During the exam, your health care provider will look into your ear with an instrument called an otoscope.  You may have tests, including a hearing test. How is this treated? This condition may be treated by:  Using ear drops to soften the earwax.  Having the earwax removed by a health care provider. The health care provider may: ? Flush the ear with water. ? Use an instrument that has a loop on the end (curette). ? Use a suction device.  Surgery to remove the wax buildup. This may be done in severe cases.  Follow these instructions at home:  Take over-the-counter and prescription medicines only as told by your health care provider.  Do not put any  objects, including cotton swabs, into your ear. You can clean the opening of your ear canal with a washcloth or facial tissue.  Follow instructions from your health care provider about cleaning your ears. Do not over-clean your ears.  Drink enough fluid to keep your urine clear or pale yellow. This will help to thin the earwax.  Keep all follow-up visits as told by your health care provider. If earwax builds up in your ears often or if you use hearing aids, consider seeing your health care provider for routine, preventive ear cleanings. Ask your health care provider how often you should schedule your cleanings.  If you have hearing aids, clean them according to instructions from the manufacturer and your health care provider. Contact a health care provider if:  You have ear pain.  You develop a fever.  You have blood, pus, or other fluid coming from your ear.  You have hearing loss.  You have ringing in your ears that does not go away.  Your symptoms do not improve with treatment.  You feel like the room is spinning (vertigo). Summary  Earwax can build up in the ear and cause discomfort or hearing loss.  The most common symptoms of this condition include reduced or muffled hearing and a feeling of fullness in the ear or feeling that the ear is plugged.  This condition may be diagnosed based on your  symptoms, your medical history, and an ear exam.  This condition may be treated by using ear drops to soften the earwax or by having the earwax removed by a health care provider.  Do not put any objects, including cotton swabs, into your ear. You can clean the opening of your ear canal with a washcloth or facial tissue. This information is not intended to replace advice given to you by your health care provider. Make sure you discuss any questions you have with your health care provider. Document Released: 01/07/2005 Document Revised: 02/10/2017 Document Reviewed: 02/10/2017 Elsevier  Interactive Patient Education  [employer].

## 2017-09-13 NOTE — Progress Notes (Signed)
Cody Harvey is a 21 y.o. male who presents to [facility] today for ADHD, obesity, and cerumen impaction.  Cody has a history of autism. He additionally has ADHD. The ADHD is well controlled with Adderall as it below. Additionally he takes clonidine at bedtime to help him sleep. He's been on a stable dose for sometime now and feels well.  Additionally patient notes your pressure and has a history of cerumen impaction. He suspects he has cerumen impaction today. He denies significant pain fevers or chills.  Additionally Cody is obese. He does not eat a careful diet. He does not exercise regularly.   Past Medical History:  Diagnosis Date  . 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 ADD (attention deficit disorder)   . Obesity   . Sleep disturbance, unspecified    Past Surgical History:  Procedure Laterality Date  . TYMPANOSTOMY TUBE PLACEMENT     Social History  Substance Use Topics  . Smoking status: Never Smoker  . Smokeless tobacco: Never Used  . Alcohol use No   family history includes Diabetes in his father; Fibromyalgia in his mother.  ROS as above:  Medications: Current Outpatient Prescriptions  Medication Sig Dispense Refill  . amphetamine-dextroamphetamine (ADDERALL) 30 MG tablet Take one tablet twice a day. 60 tablet 0  . cloNIDine (CATAPRES) 0.2 MG tablet Take 4 tablets (0.8 mg total) by mouth at bedtime. 360 tablet 1  . amphetamine-dextroamphetamine (ADDERALL) 30 MG tablet Take 1 tablet by mouth 2 (two) times daily. 60 tablet 0  . amphetamine-dextroamphetamine (ADDERALL) 30 MG tablet Take 1 tablet by mouth 2 (two) times daily. 60 tablet 0   No current facility-administered medications for this visit.    No Known Allergies  Health Maintenance Health Maintenance  Topic Date Due  . HIV  Screening  12/25/2008  . TETANUS/TDAP  12/25/2012  . INFLUENZA VACCINE  Completed     Exam:  BP (!) 116/58   Pulse 84   Wt 266 lb (120.7 kg)   BMI 35.09 kg/m   Wt Readings from Last 10 Encounters:  09/13/17 266 lb (120.7 kg)  04/06/17 270 lb (122.5 kg)  09/18/16 271 lb (122.9 kg)  03/25/16 273 lb (123.8 kg)  11/06/15 269 lb (122 kg)  03/27/15 265 lb (120.2 kg)  08/29/14 258 lb (117 kg)  04/17/14 264 lb (119.7 kg)  11/22/13 268 lb (121.6 kg) (>99 %, Z= 2.66)*  05/31/13 271 lb (122.9 kg) (>99 %, Z= 2.71)*   * Growth percentiles are based on CDC 2-20 Years data.    Gen: Well NAD HEENT: EOMI,  MMM Cerumen impaction bilaterally Lungs: Normal work of breathing. CTABL Heart: RRR no MRG Abd: NABS, Soft. Nondistended, Nontender Exts: Brisk capillary refill, warm and well perfused.   Cerumen was irrigated   No results found for this or any previous visit (from the past 72 hour(s)). No results found.    Assessment and Plan: 21 y.o. male with  ADHD: Doing well continue Adderall.  Insomnia: Doing well continue high-dose clonidine at bedtime.  Cerumen impaction relieved today. Recommended over-the-counter Debrox drops at bedtime.  Obesity: Discussed dietary strategies. Obtain basic fasting labs listed below in the near future.  Influenza vaccine given today.   Orders Placed This Encounter  Procedures  . Flu Vaccine QUAD 36+ mos IM  . CBC  . COMPLETE METABOLIC PANEL WITH GFR  .  Hemoglobin A1c  . Lipid Panel w/reflex Direct LDL  . TSH  . VITAMIN D 25 Hydroxy (Vit-D Deficiency, Fractures)   Meds ordered this encounter  Medications  . amphetamine-dextroamphetamine (ADDERALL) 30 MG tablet    Sig: Take one tablet twice a day.    Dispense:  60 tablet    Refill:  0    Fill now  . cloNIDine (CATAPRES) 0.2 MG tablet    Sig: Take 4 tablets (0.8 mg total) by mouth at bedtime.    Dispense:  360 tablet    Refill:  1  . amphetamine-dextroamphetamine (ADDERALL) 30 MG  tablet    Sig: Take 1 tablet by mouth 2 (two) times daily.    Dispense:  60 tablet    Refill:  0    Fill in 30 days  . amphetamine-dextroamphetamine (ADDERALL) 30 MG tablet    Sig: Take 1 tablet by mouth 2 (two) times daily.    Dispense:  60 tablet    Refill:  0    Fill in 60 days     Discussed warning signs or symptoms. Please see discharge instructions. Patient expresses understanding.

## 2017-09-13 NOTE — Telephone Encounter (Signed)
OK with me.

## 2017-09-14 ENCOUNTER — Encounter: Payer: Self-pay | Admitting: Family Medicine

## 2018-01-06 ENCOUNTER — Telehealth: Payer: Self-pay | Admitting: Family Medicine

## 2018-01-06 MED ORDER — AMPHETAMINE-DEXTROAMPHETAMINE 30 MG PO TABS: 30.0000 mg | ORAL_TABLET | Freq: Two times a day (BID) | ORAL | 0 refills | Status: DC

## 2018-01-06 NOTE — Telephone Encounter (Signed)
Pt's mother called for refill, states they are leaving town for a family emergency but they will schedule a follow up when they return next week.

## 2018-02-11 ENCOUNTER — Encounter: Payer: Self-pay | Admitting: Family Medicine

## 2018-02-11 ENCOUNTER — Ambulatory Visit: Payer: BLUE CROSS/BLUE SHIELD | Admitting: Family Medicine

## 2018-02-11 VITALS — BP 98/63 | HR 66 | Ht 72.0 in | Wt 269.0 lb

## 2018-02-11 DIAGNOSIS — Z8669 Personal history of other diseases of the nervous system and sense organs: Secondary | ICD-10-CM

## 2018-02-11 DIAGNOSIS — F5101 Primary insomnia: Secondary | ICD-10-CM | POA: Diagnosis not present

## 2018-02-11 DIAGNOSIS — F900 Attention-deficit hyperactivity disorder, predominantly inattentive type: Secondary | ICD-10-CM | POA: Diagnosis not present

## 2018-02-11 DIAGNOSIS — F840 Autistic disorder: Secondary | ICD-10-CM

## 2018-02-11 DIAGNOSIS — Z6836 Body mass index (BMI) 36.0-36.9, adult: Secondary | ICD-10-CM

## 2018-02-11 DIAGNOSIS — E6609 Other obesity due to excess calories: Secondary | ICD-10-CM

## 2018-02-11 HISTORY — DX: Personal history of other diseases of the nervous system and sense organs: Z86.69

## 2018-02-11 MED ORDER — CLONIDINE HCL 0.2 MG PO TABS: 0.8000 mg | ORAL_TABLET | Freq: Every day | ORAL | 1 refills | Status: DC

## 2018-02-11 MED ORDER — AMPHETAMINE-DEXTROAMPHETAMINE 30 MG PO TABS: 30.0000 mg | ORAL_TABLET | Freq: Two times a day (BID) | ORAL | 0 refills | Status: DC

## 2018-02-11 NOTE — Progress Notes (Signed)
       Cody Harvey is a 22 y.o. male who presents to [facility] today for follow-up of ADHD, cerumen impaction, obesity, autism.  Cody was seen in October for establish care visit.  He has a history of autism and ADHD that is controlled with behavioral modification but also benefiting from Adderall.  He has been on this medication for a long time now and it helps him focus and function and be more independent.   Additionally Cody has a history of insomnia related to his autism diagnosis.  He does very well on high-dose clonidine listed below.  He denies any episodes of lightheadedness or dizziness.  He notes that he needs a refill of this medication.  Obesity: Cody has a diagnosis of obesity.  We attempted to obtain basic fasting labs including lipids A1c metabolic panel CBC thyroid to further track down fundamental underlying causes of obesity or are worsened by obesity.  He was unable to get the labs and is interested in getting his labs done in the near future if possible.  Cerumen impaction: Cody has a history of bilateral cerumen impaction.  He has been cleaning his ears out regularly and feels well.  No obstructive symptoms today.  Past Medical History:  Diagnosis Date  . 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 ADD (attention deficit disorder)   . History of impacted cerumen 02/11/2018  . Obesity   . Sleep disturbance, unspecified    Past Surgical History:  Procedure Laterality Date  . TYMPANOSTOMY TUBE PLACEMENT     Social History   Tobacco Use  . Smoking status: Never Smoker  . Smokeless tobacco: Never Used  Substance Use Topics  . Alcohol use: No   family history includes Diabetes in his father; Fibromyalgia in his mother.  ROS as above:  Medications: Current Outpatient Medications  Medication Sig  Dispense Refill  . amphetamine-dextroamphetamine (ADDERALL) 30 MG tablet Take 1 tablet by mouth 2 (two) times daily. 180 tablet 0  . cloNIDine (CATAPRES) 0.2 MG tablet Take 4 tablets (0.8 mg total) by mouth at bedtime. 360 tablet 1   No current facility-administered medications for this visit.    No Known Allergies  Health Maintenance Health Maintenance  Topic Date Due  . HIV Screening  12/25/2008  . TETANUS/TDAP  12/25/2012  . INFLUENZA VACCINE  Completed     Exam:  BP 98/63   Pulse 66   Ht 6' (1.829 m)   Wt 269 lb (122 kg)   BMI 36.48 kg/m  Gen: Well NAD obese HEENT: EOMI,  MMM ears free of occluding cerumen bilaterally Lungs: Normal work of breathing. CTABL Heart: RRR no MRG Abd: NABS, Soft. Nondistended, Nontender Exts: Brisk capillary refill, warm and well perfused.  Psych: Alert and oriented normal speech     Assessment and Plan: 22 y.o. male with  ADHD.  Doing well.  Refill Adderall.  Insomnia: Doing well.  Continue clonidine.  No signs of hypotension around bedtime.  Cerumen impaction history.  Doing well.  Continue home care return as needed for irrigation  Obesity: We will recheck basic fasting labs previously ordered. (Lipids, A1c, CMP TSH).    Recheck in 3 months.   Discussed warning signs or symptoms. Please see discharge instructions. Patient expresses understanding.

## 2018-02-11 NOTE — Patient Instructions (Signed)
.  Thank you for coming in today. Get labs.  Recheck in 3 months.  Return sooner if needed.

## 2018-04-27 ENCOUNTER — Telehealth: Payer: Self-pay

## 2018-04-27 DIAGNOSIS — F5101 Primary insomnia: Secondary | ICD-10-CM

## 2018-04-27 MED ORDER — CLONIDINE HCL 0.2 MG PO TABS: 0.8000 mg | ORAL_TABLET | Freq: Every day | ORAL | 1 refills | Status: DC

## 2018-04-27 NOTE — Telephone Encounter (Signed)
Sent to [facility]

## 2018-04-27 NOTE — Telephone Encounter (Signed)
Patient mother called requested a refill for a 90 day supply Clonidine 0.[facility] mg [facility]. Please advise. Wayburn Shaler,CMA

## 2018-04-27 NOTE — Telephone Encounter (Signed)
Patient mother has been informed. Rhonda Cunningham,CMA  

## 2018-05-23 ENCOUNTER — Encounter: Payer: Self-pay | Admitting: Family Medicine

## 2018-05-23 ENCOUNTER — Ambulatory Visit: Payer: BLUE CROSS/BLUE SHIELD | Admitting: Family Medicine

## 2018-05-23 VITALS — BP 118/71 | HR 68 | Wt 267.0 lb

## 2018-05-23 DIAGNOSIS — Z6835 Body mass index (BMI) 35.0-35.9, adult: Secondary | ICD-10-CM | POA: Diagnosis not present

## 2018-05-23 DIAGNOSIS — F5101 Primary insomnia: Secondary | ICD-10-CM

## 2018-05-23 DIAGNOSIS — F900 Attention-deficit hyperactivity disorder, predominantly inattentive type: Secondary | ICD-10-CM

## 2018-05-23 DIAGNOSIS — F840 Autistic disorder: Secondary | ICD-10-CM | POA: Diagnosis not present

## 2018-05-23 MED ORDER — AMPHETAMINE-DEXTROAMPHETAMINE 30 MG PO TABS: 30.0000 mg | ORAL_TABLET | Freq: Two times a day (BID) | ORAL | 0 refills | Status: DC

## 2018-05-23 NOTE — Patient Instructions (Signed)
Thank you for coming in today. Recheck in 3 months.  We will do the Tdap then.   DTaP Vaccine (Diphtheria, Tetanus, and Pertussis): What You Need to Know 1. Why get vaccinated? Diphtheria, tetanus, and pertussis are serious diseases caused by bacteria. Diphtheria and pertussis are spread from person to person. Tetanus enters the body through cuts or wounds. DIPHTHERIA causes a thick covering in the back of the throat.  It can lead to breathing problems, paralysis, heart failure, and even death.  TETANUS (Lockjaw) causes painful tightening of the muscles, usually all over the body.  It can lead to "locking" of the jaw so the victim cannot open his mouth or swallow. Tetanus leads to death in up to 2 out of 10 cases.  PERTUSSIS (Whooping Cough) causes coughing spells so bad that it is hard for infants to eat, drink, or breathe. These spells can last for weeks.  It can lead to pneumonia, seizures (jerking and staring spells), brain damage, and death.  Diphtheria, tetanus, and pertussis vaccine (DTaP) can help prevent these diseases. Most children who are vaccinated with DTaP will be protected throughout childhood. Many more children would get these diseases if we stopped vaccinating. DTaP is a safer version of an older vaccine called DTP. DTP is no longer used in the [place]. 2. Who should get DTaP vaccine and when? Children should get 5 doses of DTaP vaccine, one dose at each of the following ages:  2 months  4 months  6 months  15-18 months  4-6 years  DTaP may be given at the same time as other vaccines. 3. Some children should not get DTaP vaccine or should wait  Children with minor illnesses, such as a cold, may be vaccinated. But children who are moderately or severely ill should usually wait until they recover before getting DTaP vaccine.  Any child who had a life-threatening allergic reaction after a dose of DTaP should not get another dose.  Any child who suffered  a brain or nervous system disease within 7 days after a dose of DTaP should not get another dose.  Talk with your doctor if your child: ? had a seizure or collapsed after a dose of DTaP, ? cried non-stop for 3 hours or more after a dose of DTaP, ? had a fever over 105F after a dose of DTaP. Ask your doctor for more information. Some of these children should not get another dose of pertussis vaccine, but may get a vaccine without pertussis, called DT. 4. Older children and adults DTaP is not licensed for adolescents, adults, or children 707 years of age and older. But older people still need protection. A vaccine called Tdap is similar to DTaP. A single dose of Tdap is recommended for people 11 through 24 years of age. Another vaccine, called Td, protects against tetanus and diphtheria, but not pertussis. It is recommended every 10 years. There are separate Vaccine Information Statements for these vaccines. 5. What are the risks from DTaP vaccine? Getting diphtheria, tetanus, or pertussis disease is much riskier than getting DTaP vaccine. However, a vaccine, like any medicine, is capable of causing serious problems, such as severe allergic reactions. The risk of DTaP vaccine causing serious harm, or death, is extremely small. Mild problems (common)  Fever (up to about 1 child in 4)  Redness or swelling where the shot was given (up to about 1 child in 4)  Soreness or tenderness where the shot was given (up to about 1 child  in 4) These problems occur more often after the 4th and 5th doses of the DTaP series than after earlier doses. Sometimes the 4th or 5th dose of DTaP vaccine is followed by swelling of the entire arm or leg in which the shot was given, lasting 1-7 days (up to about 1 child in 57). Other mild problems include:  Fussiness (up to about 1 child in 3)  Tiredness or poor appetite (up to about 1 child in 10)  Vomiting (up to about 1 child in 49) These problems generally occur 1-3  days after the shot. Moderate problems (uncommon)  Seizure (jerking or staring) (about 1 child out of 14,000)  Non-stop crying, for 3 hours or more (up to about 1 child out of 1,000)  High fever, over 105F (about 1 child out of 16,000) Severe problems (very rare)  Serious allergic reaction (less than 1 out of a million doses)  Several other severe problems have been reported after DTaP vaccine. These include: ? Long-term seizures, coma, or lowered consciousness ? Permanent brain damage. These are so rare it is hard to tell if they are caused by the vaccine. Controlling fever is especially important for children who have had seizures, for any reason. It is also important if another family member has had seizures. You can reduce fever and pain by giving your child an aspirin-free pain reliever when the shot is given, and for the next 24 hours, following the package instructions. 6. What if there is a serious reaction? What should I look for? Look for anything that concerns you, such as signs of a severe allergic reaction, very high fever, or behavior changes. Signs of a severe allergic reaction can include hives, swelling of the face and throat, difficulty breathing, a fast heartbeat, dizziness, and weakness. These would start a few minutes to a few hours after the vaccination. What should I do?  If you think it is a severe allergic reaction or other emergency that can't wait, call 9-1-1 or get the person to the nearest hospital. Otherwise, call your doctor.  Afterward, the reaction should be reported to the Vaccine Adverse Event Reporting System (VAERS). Your doctor might file this report, or you can do it yourself through the VAERS web site at www.vaers.SamedayNews.es, or by calling 425-035-7729. ? VAERS is only for reporting reactions. They do not give medical advice. 7. The National Vaccine Injury Compensation Program The [employer] Vaccine Injury Compensation Program (VICP) is a federal  program that was created to compensate people who may have been injured by certain vaccines. Persons who believe they may have been injured by a vaccine can learn about the program and about filing a claim by calling (726)144-6581 or visiting the [employer] website at GoldCloset.com.ee. 8. How can I learn more?  Ask your doctor.  Call your local or state health department.  Contact the Centers for Disease Control and Prevention (CDC): ? Call 715-834-6842 (1-800-CDC-INFO) or ? Visit CDC's website at http://hunter.com/ CDC DTaP Vaccine (Diphtheria, Tetanus, and Pertussis) VIS (04/29/06) This information is not intended to replace advice given to you by your health care provider. Make sure you discuss any questions you have with your health care provider. Document Released: 09/27/2006 Document Revised: 08/20/2016 Document Reviewed: 08/20/2016 Elsevier Interactive Patient Education  2017 [employer].

## 2018-05-23 NOTE — Progress Notes (Addendum)
Cody Harvey is a 22 y.o. male who presents [facility] today for follow up on ADHD medicine.   Patient was seen in October 2018 for establishment of care and again in March 2019 for follow up. Today he is here for a medication refill. He is tolerating his Adderall well. He denies any nausea or irritability. He is sleeping well. They deny any other medical problems or issues. They are considering decreasing his dose of clonidine, but are hesitant due to previous trials that resulted in insomnia.    ROS as above:  Exam:  BP 118/71   Pulse 68   Wt 267 lb (121.1 kg)   BMI 36.21 kg/m  Gen: Well NAD HEENT: EOMI,  MMM Lungs: Normal work of breathing. CTABL Heart: RRR no MRG Abd: NABS, Soft. Nondistended, Nontender Exts: Brisk capillary refill, warm and well perfused.   Lab and Radiology Results No results found for this or any previous visit (from the past 72 hour(s)). No results found.   Assessment and Plan: 22 y.o. male with   ADHD: Patient is reporting no symptoms and states the medication is effective. He should follow up in 3 months to see how things are going.   Insomnia: Patient is tolerating his large dose of Clonidine and is sleeping well. He has tolerated this medication and dose for a very long time and I do not see any reason to change it at this time.    No orders of the defined types were placed in this encounter.  Meds ordered this encounter  Medications  . amphetamine-dextroamphetamine (ADDERALL) 30 MG tablet    Sig: Take 1 tablet by mouth 2 (two) times daily.    Dispense:  180 tablet    Refill:  0    90 days  supply     Historical information moved to improve visibility of documentation.  Past Medical History:  Diagnosis Date  . 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 ADD (attention  deficit disorder)   . History of impacted cerumen 02/11/2018  . Obesity   . Sleep disturbance, unspecified    Past Surgical History:  Procedure Laterality Date  . TYMPANOSTOMY TUBE PLACEMENT     Social History   Tobacco Use  . Smoking status: Never Smoker  . Smokeless tobacco: Never Used  Substance Use Topics  . Alcohol use: No   family history includes Diabetes in his father; Fibromyalgia in his mother.  Medications: Current Outpatient Medications  Medication Sig Dispense Refill  . amphetamine-dextroamphetamine (ADDERALL) 30 MG tablet Take 1 tablet by mouth 2 (two) times daily. 180 tablet 0  . cloNIDine (CATAPRES) 0.2 MG tablet Take 4 tablets (0.8 mg total) by mouth at bedtime. 360 tablet 1   No current facility-administered medications for this visit.    No Known Allergies  Health Maintenance Health Maintenance  Topic Date Due  . HIV Screening  12/25/2008  . TETANUS/TDAP  12/25/2012  . INFLUENZA VACCINE  07/14/2018    Discussed warning signs or symptoms. Please see discharge instructions. Patient expresses understanding.  I personally was present and performed or re-performed the history, physical exam and medical decision-making activities of this service and have verified that the service and findings are accurately documented in the student's note. ___________________________________________ Clementeen GrahamEvan Tana Trefry M.D., ABFM., CAQSM. Primary Care and Sports Medicine Adjunct Instructor of Family Medicine  [employer]

## 2018-05-24 LAB — COMPLETE METABOLIC PANEL WITH GFR
AG Ratio: 1.9 (calc) (ref 1.0–2.5)
ALBUMIN MSPROF: 4.6 g/dL (ref 3.6–5.1)
ALKALINE PHOSPHATASE (APISO): 60 U/L (ref 40–115)
ALT: 17 U/L (ref 9–46)
AST: 17 U/L (ref 10–40)
BUN: 16 mg/dL (ref 7–25)
CALCIUM: 9.8 mg/dL (ref 8.6–10.3)
CO2: 32 mmol/L (ref 20–32)
CREATININE: 0.87 mg/dL (ref 0.60–1.35)
Chloride: 100 mmol/L (ref 98–110)
GFR, EST NON AFRICAN AMERICAN: 121 mL/min/{1.73_m2} (ref >=60)
GFR, Est African American: 140 mL/min/{1.73_m2} (ref >=60)
GLUCOSE: 88 mg/dL (ref 65–99)
Globulin: 2.4 g/dL (calc) (ref 1.9–3.7)
Potassium: 4.3 mmol/L (ref 3.5–5.3)
Sodium: 139 mmol/L (ref 135–146)
Total Bilirubin: 0.6 mg/dL (ref 0.2–1.2)
Total Protein: 7 g/dL (ref 6.1–8.1)

## 2018-05-24 LAB — LIPID PANEL W/REFLEX DIRECT LDL
CHOL/HDL RATIO: 5.5 (calc) — AB (ref <5.0)
Cholesterol: 198 mg/dL (ref <200)
HDL: 36 mg/dL — AB (ref >40)
LDL Cholesterol (Calc): 132 mg/dL (calc) — ABNORMAL HIGH
NON-HDL CHOLESTEROL (CALC): 162 mg/dL — AB (ref <130)
Triglycerides: 161 mg/dL — ABNORMAL HIGH (ref <150)

## 2018-05-24 LAB — CBC
HEMATOCRIT: 47.4 % (ref 38.5–50.0)
HEMOGLOBIN: 16.3 g/dL (ref 13.2–17.1)
MCH: 27.6 pg (ref 27.0–33.0)
MCHC: 34.4 g/dL (ref 32.0–36.0)
MCV: 80.2 fL (ref 80.0–100.0)
MPV: 9.5 fL (ref 7.5–12.5)
Platelets: 217 10*3/uL (ref 140–400)
RBC: 5.91 10*6/uL — AB (ref 4.20–5.80)
RDW: 12.6 % (ref 11.0–15.0)
WBC: 10.8 10*3/uL (ref 3.8–10.8)

## 2018-05-24 LAB — HEMOGLOBIN A1C
HEMOGLOBIN A1C: 5.4 %{Hb} (ref <5.7)
Mean Plasma Glucose: 108 (calc)
eAG (mmol/L): 6 (calc)

## 2018-05-24 LAB — VITAMIN D 25 HYDROXY (VIT D DEFICIENCY, FRACTURES): Vit D, 25-Hydroxy: 15 ng/mL — ABNORMAL LOW (ref 30–100)

## 2018-05-24 LAB — TSH: TSH: 2.04 m[IU]/L (ref 0.40–4.50)

## 2018-08-24 ENCOUNTER — Ambulatory Visit: Payer: BLUE CROSS/BLUE SHIELD | Admitting: Family Medicine

## 2018-08-24 ENCOUNTER — Encounter: Payer: Self-pay | Admitting: Family Medicine

## 2018-08-24 VITALS — BP 112/59 | HR 78 | Temp 98.1°F | Wt 276.0 lb

## 2018-08-24 DIAGNOSIS — F900 Attention-deficit hyperactivity disorder, predominantly inattentive type: Secondary | ICD-10-CM

## 2018-08-24 DIAGNOSIS — R05 Cough: Secondary | ICD-10-CM | POA: Diagnosis not present

## 2018-08-24 DIAGNOSIS — R059 Cough, unspecified: Secondary | ICD-10-CM

## 2018-08-24 MED ORDER — AMPHETAMINE-DEXTROAMPHETAMINE 30 MG PO TABS: 30.0000 mg | ORAL_TABLET | Freq: Two times a day (BID) | ORAL | 0 refills | Status: DC

## 2018-08-24 MED ORDER — HYDROCOD POLST-CPM POLST ER 10-8 MG/5ML PO SUER: 5.0000 mL | Freq: Two times a day (BID) | ORAL | 0 refills | Status: DC | PRN

## 2018-08-24 NOTE — Patient Instructions (Addendum)
Thank you for coming in today. Use the cough medicine mostly at bedtime.  Call or go to the emergency room if you get worse, have trouble breathing, have chest pains, or palpitations.   Continue over the counter medicine.   Recheck in 3 months or sooner if needed.     Cough, Adult A cough helps to clear your throat and lungs. A cough may last only 2-3 weeks (acute), or it may last longer than 8 weeks (chronic). Many different things can cause a cough. A cough may be a sign of an illness or another medical condition. Follow these instructions at home:  Pay attention to any changes in your cough.  Take medicines only as told by your doctor. ? If you were prescribed an antibiotic medicine, take it as told by your doctor. Do not stop taking it even if you start to feel better. ? Talk with your doctor before you try using a cough medicine.  Drink enough fluid to keep your pee (urine) clear or pale yellow.  If the air is dry, use a cold steam vaporizer or humidifier in your home.  Stay away from things that make you cough at work or at home.  If your cough is worse at night, try using extra pillows to raise your head up higher while you sleep.  Do not smoke, and try not to be around smoke. If you need help quitting, ask your doctor.  Do not have caffeine.  Do not drink alcohol.  Rest as needed. Contact a doctor if:  You have new problems (symptoms).  You cough up yellow fluid (pus).  Your cough does not get better after 2-3 weeks, or your cough gets worse.  Medicine does not help your cough and you are not sleeping well.  You have pain that gets worse or pain that is not helped with medicine.  You have a fever.  You are losing weight and you do not know why.  You have night sweats. Get help right away if:  You cough up blood.  You have trouble breathing.  Your heartbeat is very fast. This information is not intended to replace advice given to you by your health  care provider. Make sure you discuss any questions you have with your health care provider. Document Released: 08/13/2011 Document Revised: 05/07/2016 Document Reviewed: 02/06/2015 Elsevier Interactive Patient Education  [employer].

## 2018-08-24 NOTE — Progress Notes (Signed)
Cody Harvey is a 22 y.o. male who presents to [facility] today for cough.  Cody has a 4-5 day history of cough that is bothersome at bedtime.  The cough is occasionally productive.  He notes it is interfering with sleep.  He notes multiple family members with a similar illness.  He is tried several over-the-counter medications which have not helped.  Additionally he is tried all of left over [employer] which did not help.  In the past he has had Tussionex which helped quite a bit.  Additionally he takes Adderall as below for ADHD.  This works well and allows him to focus.  He is due for refill today. ROS as above:  Exam:  BP (!) 112/59   Pulse 78   Temp 98.1 F (36.7 C) (Oral)   Wt 276 lb (125.2 kg)   BMI 37.43 kg/m  Wt Readings from Last 5 Encounters:  08/24/18 276 lb (125.2 kg)  05/23/18 267 lb (121.1 kg)  02/11/18 269 lb (122 kg)  09/13/17 266 lb (120.7 kg)  04/06/17 270 lb (122.5 kg)    Gen: Well NAD HEENT: EOMI,  MMM clear nasal discharge.  Posterior pharynx with cobblestoning.  Right tympanic membrane is normal.  Left is nonerythematous but retracted.  No cervical lymphadenopathy.  Nontender to palpation maxillary frontal sinuses. Lungs: Normal work of breathing. CTABL Heart: RRR no MRG Abd: NABS, Soft. Nondistended, Nontender Exts: Brisk capillary refill, warm and well perfused.   Lab and Radiology Results No results found for this or any previous visit (from the past 72 hour(s)). No results found.    Assessment and Plan: 22 y.o. male with cough: Likely viral.  Plan for continued over-the-counter medications as needed.  Will prescribe short duration of Tussionex.  If all is well recheck in 3 months with ADHD. New issue.  ADHD: Doing well continue Adderall.  Recheck 3 months.  Patient will schedule nurse visit in the near future for flu  and Tdap vaccines delayed today due to illness.   No orders of the defined types were placed in this encounter.  Meds ordered this encounter  Medications  . chlorpheniramine-HYDROcodone (TUSSIONEX) 10-8 MG/5ML SUER    Sig: Take 5 mLs by mouth every 12 (twelve) hours as needed for cough (cough, will cause drowsiness.).    Dispense:  120 mL    Refill:  0  . amphetamine-dextroamphetamine (ADDERALL) 30 MG tablet    Sig: Take 1 tablet by mouth 2 (two) times daily.    Dispense:  180 tablet    Refill:  0    90 days  supply     Historical information moved to improve visibility of documentation.  Past Medical History:  Diagnosis Date  . 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 ADD (attention deficit disorder)   . History of impacted cerumen 02/11/2018  . Obesity   . Sleep disturbance, unspecified    Past Surgical History:  Procedure Laterality Date  . TYMPANOSTOMY TUBE PLACEMENT     Social History   Tobacco Use  . Smoking status: Never Smoker  . Smokeless tobacco: Never Used  Substance Use Topics  . Alcohol use: No   family history includes Diabetes in his father; Fibromyalgia in his mother.  Medications: Current Outpatient Medications  Medication Sig Dispense Refill  . amphetamine-dextroamphetamine (ADDERALL) 30 MG tablet Take 1 tablet by mouth 2 (  two) times daily. 180 tablet 0  . cloNIDine (CATAPRES) 0.2 MG tablet Take 4 tablets (0.8 mg total) by mouth at bedtime. 360 tablet 1  . chlorpheniramine-HYDROcodone (TUSSIONEX) 10-8 MG/5ML SUER Take 5 mLs by mouth every 12 (twelve) hours as needed for cough (cough, will cause drowsiness.). 120 mL 0   No current facility-administered medications for this visit.    No Known Allergies   Discussed warning signs or symptoms. Please see discharge instructions. Patient expresses understanding.

## 2018-10-19 ENCOUNTER — Other Ambulatory Visit: Payer: Self-pay | Admitting: Family Medicine

## 2018-10-19 DIAGNOSIS — F5101 Primary insomnia: Secondary | ICD-10-CM

## 2018-11-09 ENCOUNTER — Telehealth: Payer: Self-pay

## 2018-11-09 MED ORDER — BENZONATATE 200 MG PO CAPS: 200.0000 mg | ORAL_CAPSULE | Freq: Three times a day (TID) | ORAL | 0 refills | Status: DC | PRN

## 2018-11-09 NOTE — Telephone Encounter (Signed)
Cody Harvey, Cody Harvey's mom, called and states they are going out of town and can't come in. She states he has a cough that is keeping him up at night and would like cough medication. Please advise.

## 2018-11-09 NOTE — Telephone Encounter (Signed)
Jerilynn Somessalon Perles [facility] [place]

## 2018-11-09 NOTE — Telephone Encounter (Signed)
Patient's mom advised 

## 2018-12-09 ENCOUNTER — Encounter: Payer: Self-pay | Admitting: Family Medicine

## 2018-12-09 ENCOUNTER — Ambulatory Visit: Payer: BLUE CROSS/BLUE SHIELD | Admitting: Family Medicine

## 2018-12-09 VITALS — BP 120/103 | HR 80 | Temp 98.0°F | Wt 280.0 lb

## 2018-12-09 DIAGNOSIS — R059 Cough, unspecified: Secondary | ICD-10-CM

## 2018-12-09 DIAGNOSIS — R05 Cough: Secondary | ICD-10-CM | POA: Diagnosis not present

## 2018-12-09 MED ORDER — PREDNISONE 10 MG PO TABS: 30.0000 mg | ORAL_TABLET | Freq: Every day | ORAL | 0 refills | Status: DC

## 2018-12-09 MED ORDER — AZITHROMYCIN 250 MG PO TABS: 250.0000 mg | ORAL_TABLET | Freq: Every day | ORAL | 0 refills | Status: DC

## 2018-12-09 MED ORDER — HYDROCOD POLST-CPM POLST ER 10-8 MG/5ML PO SUER: 5.0000 mL | Freq: Two times a day (BID) | ORAL | 0 refills | Status: DC | PRN

## 2018-12-09 NOTE — Progress Notes (Signed)
Cody Harvey is a 22 y.o. male who [facility] today for cough congestion runny nose.  Symptoms present for about 5 days.  Patient is tried LawyerTessalon Perles over-the-counter cough suppressant medications nasal spray.  This is not helped well.  He notes the cough is obnoxious and interferes with sleep.  He denies significant wheezing or shortness of breath.  In the past he is done well with hydrocodone-based cough medications.   ROS as above:  Exam:  BP (!) 120/103   Pulse 80   Temp 98 F (36.7 C) (Oral)   Wt 280 lb (127 kg)   BMI 37.97 kg/m  Wt Readings from Last 5 Encounters:  12/09/18 280 lb (127 kg)  08/24/18 276 lb (125.2 kg)  05/23/18 267 lb (121.1 kg)  02/11/18 269 lb (122 kg)  09/13/17 266 lb (120.7 kg)    Gen: Well NAD HEENT: EOMI,  MMM clear nasal discharge.  Normal posterior pharynx.  No cervical lymphadenopathy.  Mildly tender palpation maxillary sinus. Lungs: Normal work of breathing. CTABL Heart: RRR no MRG Abd: NABS, Soft. Nondistended, Nontender Exts: Harvey capillary refill, warm and well perfused.   Lab and Radiology Results No results found for this or any previous visit (from the past 72 hour(s)). No results found.    Assessment and Plan: 22 y.o. male with cough likely viral etiology possibly bronchitis.  Patient not well controlled with early conservative measures.  Plan to continue with her current medications and Tessalon Perles.  Will add hydrocodone-based cough medication.  Additionally will print backup prescription for prednisone and azithromycin.  Patient will fill and start taking prednisone if not improving and fill and take azithromycin if worsening or experience second sickening symptoms.  Reschedule in the near future to receive previously delayed flu and Tdap vaccines.   No orders of the defined types were  placed in this encounter.  Meds ordered this encounter  Medications  . chlorpheniramine-HYDROcodone (TUSSIONEX) 10-8 MG/5ML SUER    Sig: Take 5 mLs by mouth every 12 (twelve) hours as needed for cough (cough, will cause drowsiness.).    Dispense:  120 mL    Refill:  0  . predniSONE (DELTASONE) 10 MG tablet    Sig: Take 3 tablets (30 mg total) by mouth daily with breakfast.    Dispense:  15 tablet    Refill:  0  . azithromycin (ZITHROMAX) 250 MG tablet    Sig: Take 1 tablet (250 mg total) by mouth daily. Take first 2 tablets together, then 1 every day until finished.    Dispense:  6 tablet    Refill:  0    PDMP reviewed during this encounter.  Historical information moved to improve visibility of documentation.  Past Medical History:  Diagnosis Date  . 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 ADD (attention deficit disorder)   . History of impacted cerumen 02/11/2018  . Obesity   . Sleep disturbance, unspecified    Past Surgical History:  Procedure Laterality Date  . TYMPANOSTOMY TUBE PLACEMENT     Social History   Tobacco Use  . Smoking status: Never Smoker  . Smokeless tobacco: Never Used  Substance Use Topics  . Alcohol use: No   family history includes Diabetes in his father; Fibromyalgia in his mother.  Medications: Current Outpatient Medications  Medication Sig Dispense Refill  . amphetamine-dextroamphetamine (ADDERALL) 30  MG tablet Take 1 tablet by mouth 2 (two) times daily. 180 tablet 0  . benzonatate (TESSALON) 200 MG capsule Take 1 capsule (200 mg total) by mouth 3 (three) times daily as needed for cough. 45 capsule 0  . cloNIDine (CATAPRES) 0.2 MG tablet TAKE 4 TABLETS (0.8 MG TOTAL) BY MOUTH AT BEDTIME. 360 tablet 1  . azithromycin (ZITHROMAX) 250 MG tablet Take 1 tablet (250 mg total) by mouth daily. Take first 2 tablets together, then 1 every day until finished. 6 tablet 0  . chlorpheniramine-HYDROcodone  (TUSSIONEX) 10-8 MG/5ML SUER Take 5 mLs by mouth every 12 (twelve) hours as needed for cough (cough, will cause drowsiness.). 120 mL 0  . predniSONE (DELTASONE) 10 MG tablet Take 3 tablets (30 mg total) by mouth daily with breakfast. 15 tablet 0   No current facility-administered medications for this visit.    No Known Allergies   Discussed warning signs or symptoms. Please see discharge instructions. Patient expresses understanding.

## 2018-12-09 NOTE — Patient Instructions (Signed)
Thank you for coming in today. Recheck as needed.  Continue the tessalon pearles  Continue the other medicine as well.  Use the hydrocodone cough medicine as needed.  If not better fill and take the prednisone.  If worse fill and take the azithromycin antibiotic.   Have fun at monster trucks.

## 2018-12-30 ENCOUNTER — Other Ambulatory Visit: Payer: Self-pay

## 2018-12-30 DIAGNOSIS — F900 Attention-deficit hyperactivity disorder, predominantly inattentive type: Secondary | ICD-10-CM

## 2018-12-30 MED ORDER — AMPHETAMINE-DEXTROAMPHETAMINE 30 MG PO TABS: 30.0000 mg | ORAL_TABLET | Freq: Two times a day (BID) | ORAL | 0 refills | Status: DC

## 2018-12-30 NOTE — Telephone Encounter (Signed)
Patient request refill for Adderall 30 mg. Please advise. Rhonda Cunningham,CMA  

## 2019-01-03 ENCOUNTER — Other Ambulatory Visit: Payer: Self-pay

## 2019-01-03 DIAGNOSIS — F900 Attention-deficit hyperactivity disorder, predominantly inattentive type: Secondary | ICD-10-CM

## 2019-01-03 NOTE — Telephone Encounter (Signed)
[facility] is [facility]mail order said they have not filled the medication.   Please advise. Rhonda Cunningham,CMA

## 2019-01-04 MED ORDER — AMPHETAMINE-DEXTROAMPHETAMINE 30 MG PO TABS: 30.0000 mg | ORAL_TABLET | Freq: Two times a day (BID) | ORAL | 0 refills | Status: DC

## 2019-01-19 ENCOUNTER — Encounter (HOSPITAL_COMMUNITY): Payer: Self-pay | Admitting: Psychiatry

## 2019-01-19 ENCOUNTER — Ambulatory Visit (INDEPENDENT_AMBULATORY_CARE_PROVIDER_SITE_OTHER): Payer: BLUE CROSS/BLUE SHIELD | Admitting: Psychiatry

## 2019-01-19 VITALS — BP 126/80 | HR 99 | Ht 72.0 in | Wt 284.0 lb

## 2019-01-19 DIAGNOSIS — F900 Attention-deficit hyperactivity disorder, predominantly inattentive type: Secondary | ICD-10-CM

## 2019-01-19 DIAGNOSIS — F422 Mixed obsessional thoughts and acts: Secondary | ICD-10-CM

## 2019-01-19 DIAGNOSIS — F840 Autistic disorder: Secondary | ICD-10-CM | POA: Diagnosis not present

## 2019-01-19 DIAGNOSIS — F5102 Adjustment insomnia: Secondary | ICD-10-CM | POA: Diagnosis not present

## 2019-01-19 DIAGNOSIS — F411 Generalized anxiety disorder: Secondary | ICD-10-CM

## 2019-01-19 MED ORDER — ESCITALOPRAM OXALATE 10 MG PO TABS: 10.0000 mg | ORAL_TABLET | Freq: Every day | ORAL | 0 refills | Status: DC

## 2019-01-19 NOTE — Progress Notes (Signed)
Psychiatric Initial Adult Assessment   Patient Identification: Cody Harvey MRN:  [id number] Date of Evaluation:  01/19/2019 Referral Source: Dr. Denyse Amassorey Chief Complaint:   Visit Diagnosis:    ICD-10-CM   1. GAD (generalized anxiety disorder) F41.1   2. Active autistic disorder F84.0   3. Adjustment insomnia F51.02   4. Attention deficit hyperactivity disorder (ADHD), predominantly inattentive type F90.0     History of Present Illness:  23 years old single white male  With her mom. Referred for management of possible mood symptoms and adjustment to his condition of autistic disorder.  Patient is here with his mom mom also had collaborated information regarding his condition diagnosed with autism spectrum he did finish high school was diagnosed with ADHD is currently on Adderall taking 30 mg twice a day with sometimes half a dose now. Has had Geophysicist/field seismologistTeaching Assistant or special education classes at times while in school  He follows some strict schedule if he gets distracted it worries him he worries about excessive worries he worries about finances he worries about his mom taking care of him at times.  He follows some stereotypical actions.  Gets upset easily difficult to handle anger at times. Difficult going out he does not want to move around people nearby or have people around him he hears buzzing sound and that gets enhanced it is difficult to be around people or in crowds  He mostly stays home video games mother describes he plays games which are at a level of 808 to 25 years of age [address] games and relevant  Otherwise is cooperative with the parents at times when he gets upset he starts hitting his head or leg he has done this behavior when he was in school when he would get upset but the school especially high school was very cooperative all the students when he graduated  Does not no psychotic symptoms does not endorse sadness he said he keeps himself busy he does not understand what  depression is  He does endorse worries he started talking about coronavirus sister talk about global things that can make a person anxious when he watches news or listens to things that upsets him and he gets distracted  He feels he cannot function around people because it upsets him his sensations gets enhanced he wants to get away from situations which are not in his routine and he gets disruptive and it starts  Denies hopelessness no suicidal thoughts  He follows up with a neurologist has been diagnosed with multiple ear infection when he was younger started having developmental delays around 18 months he did finish high school he was good in math and science but in maturity level and in sensory and social integration he has had difficulties. Parents have kept him away from people her job because of his underlying condition and so that he does not get disruptive or more decompensated.  No trauma No drug use   Modifying factor": parents, sister Aggravating Factor: gets upset easily with disruptions, change in routine or around people/crowds Over analyze worries   Past Psychiatric History: ADHD, autism  Previous Psychotropic Medications: No   Substance Abuse History in the last 12 months:  No.  Consequences of Substance Abuse: NA  Past Medical History:  Past Medical History:  Diagnosis Date  . 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 ADD (attention deficit disorder)   . History of impacted cerumen 02/11/2018  . Obesity   .  Sleep disturbance, unspecified     Past Surgical History:  Procedure Laterality Date  . TYMPANOSTOMY TUBE PLACEMENT      Family Psychiatric History: mother fibromyalgia  Family History:  Family History  Problem Relation Age of Onset  . Fibromyalgia Mother   . Diabetes Father     Social History:   Social History   Socioeconomic History  . Marital status: Single    Spouse name: Not on file  .  Number of children: Not on file  . Years of education: Not on file  . Highest education level: Not on file  Occupational History  . Not on file  Social Needs  . Financial resource strain: Not on file  . Food insecurity:    Worry: Not on file    Inability: Not on file  . Transportation needs:    Medical: Not on file    Non-medical: Not on file  Tobacco Use  . Smoking status: Never Smoker  . Smokeless tobacco: Never Used  Substance and Sexual Activity  . Alcohol use: No  . Drug use: No  . Sexual activity: Not on file  Lifestyle  . Physical activity:    Days per week: Not on file    Minutes per session: Not on file  . Stress: Not on file  Relationships  . Social connections:    Talks on phone: Not on file    Gets together: Not on file    Attends religious service: Not on file    Active member of club or organization: Not on file    Attends meetings of clubs or organizations: Not on file    Relationship status: Not on file  Other Topics Concern  . Not on file  Social History Narrative  . Not on file    Additional Social History: grew up with parents.  Supportive parents he went through high school and regular but sometimes had to have a Geophysicist/field seismologist for special education Lives with his parents.  Allergies:  No Known Allergies  Metabolic Disorder Labs: Lab Results  Component Value Date   HGBA1C 5.4 05/23/2018   MPG 108 05/23/2018   No results found for: PROLACTIN Lab Results  Component Value Date   CHOL 198 05/23/2018   TRIG 161 (H) 05/23/2018   HDL 36 (L) 05/23/2018   CHOLHDL 5.5 (H) 05/23/2018   VLDL 23 09/20/2014   LDLCALC 132 (H) 05/23/2018   LDLCALC 115 (H) 09/20/2014   Lab Results  Component Value Date   TSH 2.04 05/23/2018    Therapeutic Level Labs: No results found for: LITHIUM No results found for: CBMZ No results found for: VALPROATE  Current Medications: Current Outpatient Medications  Medication Sig Dispense Refill  .  amphetamine-dextroamphetamine (ADDERALL) 30 MG tablet Take 1 tablet by mouth 2 (two) times daily. 180 tablet 0  . cloNIDine (CATAPRES) 0.2 MG tablet TAKE 4 TABLETS (0.8 MG TOTAL) BY MOUTH AT BEDTIME. 360 tablet 1  . azithromycin (ZITHROMAX) 250 MG tablet Take 1 tablet (250 mg total) by mouth daily. Take first 2 tablets together, then 1 every day until finished. (Patient not taking: Reported on 01/19/2019) 6 tablet 0  . benzonatate (TESSALON) 200 MG capsule Take 1 capsule (200 mg total) by mouth 3 (three) times daily as needed for cough. (Patient not taking: Reported on 01/19/2019) 45 capsule 0  . chlorpheniramine-HYDROcodone (TUSSIONEX) 10-8 MG/5ML SUER Take 5 mLs by mouth every 12 (twelve) hours as needed for cough (cough, will cause drowsiness.). (Patient not taking:  Reported on 01/19/2019) 120 mL 0  . predniSONE (DELTASONE) 10 MG tablet Take 3 tablets (30 mg total) by mouth daily with breakfast. (Patient not taking: Reported on 01/19/2019) 15 tablet 0   No current facility-administered medications for this visit.     Musculoskeletal: Strength & Muscle Tone: within normal limits Gait & Station: normal Patient leans: no lean  Psychiatric Specialty Exam: Review of Systems  Cardiovascular: Negative for chest pain.  Skin: Negative for rash.  Neurological: Negative for tremors.  Psychiatric/Behavioral: Negative for substance abuse. The patient is nervous/anxious.     Blood pressure 126/80, pulse 99, height 6' (1.829 m), weight 284 lb (128.8 kg).Body mass index is 38.52 kg/m.  General Appearance: Casual  Eye Contact:  Fair  Speech:  Normal Rate  Volume:  Normal  Mood:  Anxious  Affect:  Congruent  Thought Process:  Coherent but gents entangled in worries  Orientation:  Full (Time, Place, and Person)  Thought Content:  Rumination  Suicidal Thoughts:  No  Homicidal Thoughts:  No  Memory:  Immediate;   Fair Recent;   Fair  Judgement:  Fair  Insight:  Shallow  Psychomotor Activity:  Normal   Concentration:  Concentration: Fair and Attention Span: Fair  Recall:  [employer]Fair  Fund of Knowledge:Fair  Language: Fair  Akathisia:  No  Handed:  Right  AIMS (if indicated):  not done  Assets:  Social Support  ADL's:  Intact  Cognition: WNL  Sleep:  Fair   Screenings: GAD-7     Office Visit from 01/19/2019 in [facility] [place]  Total GAD-7 Score  7    PHQ2-9     Office Visit from 01/19/2019 in [facility] AT [place] [facility] [place] [facility]  PHQ-2 Total Score  0  0  0      Assessment and Plan: as follows  Autism Spectrum: He has been diagnosed and currently because of his underlying condition has difficulty adjusting to change or to be around people at times gets upset easily and do stereotypical movements   Will refer him to therapy.  To work on Pharmacologistcoping skills and to understand how to work on distraction from worries Clinic will work on medication and  therapy to have him better adjustment to life/underlying condition and anxiety.  Patient's parents are very supportive.  GAD with OCD traits: start lexapro 5mg  increase to 10mg  in few days. Refer to therapy to work on distractions and not over analyze/generalize.  ADHD: on adderall by Primary care . Mom understands not to increase dose as it may contribute to further anxiety  More than 50% time spent in counseling coordination of care including patient education reviewed side effects and concerns were addressed  FU 4 w or earlier if needed  Thresa RossNadeem Azhia Siefken, MD 2/6/202011:20 AM

## 2019-02-01 ENCOUNTER — Ambulatory Visit (INDEPENDENT_AMBULATORY_CARE_PROVIDER_SITE_OTHER): Payer: BLUE CROSS/BLUE SHIELD | Admitting: Licensed Clinical Social Worker

## 2019-02-01 DIAGNOSIS — F411 Generalized anxiety disorder: Secondary | ICD-10-CM | POA: Diagnosis not present

## 2019-02-01 DIAGNOSIS — F840 Autistic disorder: Secondary | ICD-10-CM | POA: Diagnosis not present

## 2019-02-01 DIAGNOSIS — F5102 Adjustment insomnia: Secondary | ICD-10-CM | POA: Diagnosis not present

## 2019-02-01 DIAGNOSIS — F900 Attention-deficit hyperactivity disorder, predominantly inattentive type: Secondary | ICD-10-CM | POA: Diagnosis not present

## 2019-02-01 NOTE — Progress Notes (Signed)
Comprehensive Clinical Assessment (CCA) Note  02/01/2019 Cody Harvey [id number]  Visit Diagnosis:      ICD-10-CM   1. 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 Adjustment insomnia F51.02   3. Attention deficit hyperactivity disorder (ADHD), predominantly inattentive type F90.0   4. GAD (generalized anxiety disorder) F41.1       CCA Part One  Part One has been completed on paper by the patient.  (See scanned document in Chart Review)  CCA Part Two A  Intake/Chief Complaint:  CCA Intake With Chief Complaint CCA Part Two Date: 02/01/19 CCA Part Two Time: 1625 Chief Complaint/Presenting Problem: diagnosed with Autism since 1, has a lot of anxiety, being treated by psychiatrist, calming techniques will be helpful, not usually calm, playful and hyper at times but friendly at times Patients Currently Reported Symptoms/Problems: mom describes mood like a roller coaster when happy then hear him screaming, hit on himself on legs, mom helps him to get out of it Collateral Involvement: [name], sister [name] in session Individual's Strengths: Tries to help mom with her disability, tries to help around the house, has math and computer skills care he is interested in working on [employer] and were documents Individual's Preferences: Get him to the point of not focusing so much on something, for example coronavirus he will look at things negatively, take things to extremes, needs to work on perspective on things Individual's Abilities: Has math and computer skills, favorite thing to do is play video games and watch TV or read and watch videos on different devices Type of Services Patient Feels Are Needed: Therapy, med management Initial Clinical Notes/Concerns: Psychiatric history-he is here diagnosed with autism at 1, always had a TA with him at school even through high school, academics was good but was always prompted to stay on focus.  Through the school he had people that he worked with, counselors and  therapist.  He has been on clonidine since 1 for sleep disorder and Adderall, Dr. Gilmore LarocheAkhtar just started him on something for anxiety, and also taking vitamin D pills since last blood work.  Family history of mental health and drug and alcohol husband side there is a strain of autism and mom reports depression related to fibromyalgia and arthritis.  Medical-patient has been pretty healthy, trying to not gain and do some things to lose weight  Mental Health Symptoms Depression:  Depression: N/A  Mania:  Mania: N/A  Anxiety:   Anxiety: Worrying, Difficulty concentrating, Fatigue, Irritability, Tension, Restlessness(getting overwhelmed about things, gets stressed when on point for 15 minutes and then redirect gets stressed and fatigue, )  Psychosis:     Trauma:     Obsessions:     Compulsions:     Inattention:     Hyperactivity/Impulsivity:     Oppositional/Defiant Behaviors:     Borderline Personality:     Other Mood/Personality Symptoms:  Other Mood/Personality Symptoms: sleep is ok with meds, anxiety-restlessness runs around the house things and circles, also gets into rhythmic hitting arms on legs can be hard, depending on what he is listening to and how much energy is.  Will plop down on couch as restlessness   Mental Status Exam Appearance and self-care  Stature:  Stature: Tall  Weight:  Weight: Overweight  Clothing:  Clothing: Casual  Grooming:  Grooming: Normal  Cosmetic use:  Cosmetic Use: None  Posture/gait:  Posture/Gait: Normal  Motor activity:  Motor Activity: Not Remarkable  Sensorium  Attention:  Attention: Normal  Concentration:  Concentration: Focuses  on irrelevancies  Orientation:  Orientation: X5  Recall/memory:  Recall/Memory: Normal  Affect and Mood  Affect:  Affect: Anxious  Mood:  Mood: Anxious, Euthymic, Irritable(mood lability, mood disorder, and reacting look at things in extremes, can be extreme around certain things such as being outside with bees, stinging and  flying insect)  Relating  Eye contact:  Eye Contact: Normal  Facial expression:  Facial Expression: Anxious  Attitude toward examiner:  Attitude Toward Examiner: Cooperative  Thought and Language  Speech flow: Speech Flow: Normal  Thought content:  Thought Content: Appropriate to mood and circumstances  Preoccupation:     Hallucinations:     Organization:   tangential  [employer] of Knowledge:  Fund of Knowledge: unable to access)  Intelligence:  Intelligence: (unable to access)  Abstraction:  Abstraction: Development worker, international aid:  Judgement: Fair  Reality Testing:  Reality Testing: Realistic  Insight:  Insight: Fair  Decision Making:   adequate  Social Functioning  Social Maturity:  Social Maturity: Isolates(He stays by himself, he Podell things the way he wants to do things makes it hard)  Social Judgement:  Social Judgement: Normal  Stress  Stressors:  Stressors: Money(Worried about things to the news things that do not even affect them, politics, about mom's health had surgery last week and this week for cataracts worries about him, his sugar is will go low and will get anxious,)  Coping Ability:  Coping Ability: Overwhelmed(Does not like it when people yell get mad does not understand it is a natural motion)  Skill Deficits:     Supports:      Family and Psychosocial History: Family history Are you sexually active?: No What is your sexual orientation?: Patient is attracted to girls Has your sexual activity been affected by drugs, alcohol, medication, or emotional stress?: n/a Does patient have children?: No  Childhood History:  Childhood History By whom was/is the patient raised?: Both parents Additional childhood history information: Biggest thing that is happen is grandmother died in a car accident after having survived cancer 3 times Description of patient's relationship with caregiver when they were a child: mom and dad-good Patient's description of current  relationship with people who raised him/her: mom, good-good How were you disciplined when you got in trouble as a child/adolescent?: No problems getting in trouble Does patient have siblings?: Yes Number of Siblings: 1 Description of patient's current relationship with siblings: 1 sister [name] who is 21-along fairly well Did patient suffer any verbal/emotional/physical/sexual abuse as a child?: No Did patient suffer from severe childhood neglect?: No Has patient ever been sexually abused/assaulted/raped as an adolescent or adult?: No Was the patient ever a victim of a crime or a disaster?: No Witnessed domestic violence?: No Has patient been effected by domestic violence as an adult?: No  CCA Part Two B  Employment/Work Situation: Employment / Work Psychologist, occupational Employment situation: (Drives he would have difficulties in the work place that he will stay on something and reacts a certain way, changes hard for him with his reaction people might not react to him, patient may get frustrated at work and not be able to calm himself) Patient's job has been impacted by current illness: (Patient can be reactionary to different things) Describe how patient's job has been impacted: n/a What is the longest time patient has a held a job?: n/a Did You Receive Any Psychiatric Treatment/Services While in the Military?: No Are There Guns or Other Weapons in Your Home?: No  Education: Education  School Currently Attending: no Last Grade Completed: 12 Name of High School: [address] 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  Mom concerned because he was sheltered, had some special ed classes and worked out really well Did Garment/textile technologist From [employer]?: Yes Did Theme park manager?: No Did You Attend Graduate School?: No Did You Have Any Special Interests In School?: He does better with things that are black and white like math and science, spelling so words were hard, so English  was hard and reading and following the narrative Did You Have An Individualized Education Program (IIEP): Yes(For autism) Did You Have Any Difficulty At School?: Yes(Times get frustrated where he hit his head on the concrete, scratch himself frustrated) Were Any Medications Ever Prescribed For These Difficulties?: Yes Medications Prescribed For School Difficulties?: Adderall helped somewhat, every once in a while would have a meltdown  Religion: Religion/Spirituality Are You A Religious Person?: No(Jewish but no particular religion) How Might This Affect Treatment?: n/a  Leisure/Recreation: Leisure / Recreation Leisure and Hobbies: see above  Exercise/Diet: Exercise/Diet Do You Exercise?: Yes What Type of Exercise Do You Do?: Run/Walk(In-house walks) How Many Times a Week Do You Exercise?: (Little bit each day but mostly his routine) Have You Gained or Lost A Significant Amount of Weight in the Past Six Months?: No Do You Follow a Special Diet?: No Do You Have Any Trouble Sleeping?: No(Sleep is good on meds)  CCA Part Two C  Alcohol/Drug Use: Alcohol / Drug Use Pain Medications: n/a Prescriptions: See med list Over the Counter: Med list History of alcohol / drug use?: No history of alcohol / drug abuse                      CCA Part Three  ASAM's:  Six Dimensions of Multidimensional Assessment  Dimension 1:  Acute Intoxication and/or Withdrawal Potential:     Dimension 2:  Biomedical Conditions and Complications:     Dimension 3:  Emotional, Behavioral, or Cognitive Conditions and Complications:     Dimension 4:  Readiness to Change:     Dimension 5:  Relapse, Continued use, or Continued Problem Potential:     Dimension 6:  Recovery/Living Environment:      Substance use Disorder (SUD)    Social Function:  Social Functioning Social Maturity: Isolates(He stays by himself, he Podell things the way he wants to do things makes it hard) Social Judgement:  Normal  Stress:  Stress Stressors: Money(Worried about things to the news things that do not even affect them, politics, about mom's health had surgery last week and this week for cataracts worries about him, his sugar is will go low and will get anxious,) family in process of selling house Coping Ability: Overwhelmed(Does not like it when people yell get mad does not understand it is a natural motion) Patient Takes Medications The Way The Doctor Instructed?: Yes Priority Risk: Low Acuity  Risk Assessment- Self-Harm Potential: Risk Assessment For Self-Harm Potential Thoughts of Self-Harm: No current thoughts Method: No plan Availability of Means: No access/NA Additional Information for Self-Harm Potential: Acts of Self-harm Additional Comments for Self-Harm Potential: hitting himself occasionally, relates to escalation of emotions such as anger and frustration, he does wants to hurt himself, but spent up energy that has to get out of his system, hitting himself about 3-4 times a month  Risk Assessment -Dangerous to Others Potential: Risk Assessment For Dangerous to Others Potential Method: No  Plan Availability of Means: No access or NA Intent: Vague intent or NA Notification Required: No need or identified person  DSM5 Diagnoses: Patient Active Problem List   Diagnosis Date Noted  . History of impacted cerumen 02/11/2018  . Obesity 03/28/2015  . ADHD (attention deficit hyperactivity disorder) 11/24/2011  . 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 Insomnia 04/03/2010  . 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  Patient diagnosed with autism, ADHD and generalized anxiety disorder. Mom presented significant amount of material, although patient was active in session often his conversation was tangential.  Mom reports symptoms of mood lability he can be happy one minute and then be frustrated, occasionally SIB (3-4 times a month)  Describes will get stressed about something by staying on a certain subject and needing redirection, thinking  in extremes, needs to work on perspective to better assess that does not lead to anxiety, will engage in negative forecasting.  Patient denies SI, HI or substance abuse and denies past SA.  Identified that it would be helpful for patient to learn calming strategies to help decrease anxiety.  It is recommended for individual therapy to help him work on strategies such as meditation and mindfulness to help him in calming his symptoms as well as other strategies to decrease anxiety.  Therapist will utilize CBT strategies to help him with cognitive distortions, changes of behavior that will help manage mood, utilize mood regulation strategies, stress management strategies, as well as strength based and supportive interventions.    Referrals to Alternative Service(s): Referred to Alternative Service(s):   Place:   Date:   Time:    Referred to Alternative Service(s):   Place:   Date:   Time:    Referred to Alternative Service(s):   Place:   Date:   Time:    Referred to Alternative Service(s):   Place:   Date:   Time:     [place]

## 2019-02-08 ENCOUNTER — Encounter: Payer: Self-pay | Admitting: Family Medicine

## 2019-02-08 ENCOUNTER — Ambulatory Visit: Payer: BLUE CROSS/BLUE SHIELD | Admitting: Family Medicine

## 2019-02-08 ENCOUNTER — Ambulatory Visit: Payer: Self-pay | Admitting: Family Medicine

## 2019-02-08 ENCOUNTER — Ambulatory Visit (INDEPENDENT_AMBULATORY_CARE_PROVIDER_SITE_OTHER): Payer: BLUE CROSS/BLUE SHIELD

## 2019-02-08 VITALS — BP 134/63 | HR 89 | Temp 98.1°F | Wt 283.0 lb

## 2019-02-08 DIAGNOSIS — R059 Cough, unspecified: Secondary | ICD-10-CM

## 2019-02-08 DIAGNOSIS — R4681 Obsessive-compulsive behavior: Secondary | ICD-10-CM | POA: Diagnosis not present

## 2019-02-08 DIAGNOSIS — F840 Autistic disorder: Secondary | ICD-10-CM | POA: Diagnosis not present

## 2019-02-08 DIAGNOSIS — F411 Generalized anxiety disorder: Secondary | ICD-10-CM

## 2019-02-08 DIAGNOSIS — R0989 Other specified symptoms and signs involving the circulatory and respiratory systems: Secondary | ICD-10-CM | POA: Diagnosis not present

## 2019-02-08 DIAGNOSIS — R05 Cough: Secondary | ICD-10-CM

## 2019-02-08 HISTORY — DX: Generalized anxiety disorder: F41.1

## 2019-02-08 HISTORY — DX: Obsessive-compulsive behavior: R46.81

## 2019-02-08 MED ORDER — PREDNISONE 10 MG PO TABS: 30.0000 mg | ORAL_TABLET | Freq: Every day | ORAL | 0 refills | Status: DC

## 2019-02-08 MED ORDER — HYDROCOD POLST-CPM POLST ER 10-8 MG/5ML PO SUER: 5.0000 mL | Freq: Two times a day (BID) | ORAL | 0 refills | Status: DC | PRN

## 2019-02-08 MED ORDER — ALBUTEROL SULFATE 108 (90 BASE) MCG/ACT IN AEPB: 1.0000 | INHALATION_SPRAY | RESPIRATORY_TRACT | 1 refills | Status: DC | PRN

## 2019-02-08 NOTE — Progress Notes (Signed)
Cody Harvey is a 23 y.o. male who presents to [facility] today for cough.  Cody has a 6-day history of cough.  He notes wheezing and mildly productive cough.  He has nasal discharge and postnasal drainage nasal congestion.  Symptoms are worse in the morning.  His mother is sick with similar illness.  He is tried [employer].  In the past he is done well with hydrocodone-based cough syrup his cough gets bad.  His cough is interfering with sleep.  Additionally in the interim Cody has been seen by psychiatry and for counseling anxiety related to ADHD.  He was started on Lexapro initially 5 mg increasing to 10 mg.  He has been to 1 session of counseling and is working on learning some coping mechanisms. He thinks this helped.   Additionally Cody is in the process of applying for so security disability.  His main cause for disability is his autism and intellectual disability as well as his ADHD and anxiety disorders.  He has never worked in his adult life.  He was able to attend school but required significant help from a teacher's assistant every day.  He is tried some volunteering but never was able to work independently.  He and his mother note that he requires significant assistance to complete simple tasks.  He and his mother also note that he has significant difficulties with interpersonal interactions.  He often missed read situations.  He will often have panic attacks.     ROS as above:  Exam:  BP 134/63   Pulse 89   Temp 98.1 F (36.7 C) (Oral)   Wt 283 lb (128.4 kg)   SpO2 96%   BMI 38.38 kg/m  Wt Readings from Last 5 Encounters:  02/08/19 283 lb (128.4 kg)  01/19/19 284 lb (128.8 kg)  12/09/18 280 lb (127 kg)  08/24/18 276 lb (125.2 kg)  05/23/18 267 lb (121.1 kg)    Gen: Well NAD HEENT: EOMI,  MMM clear nasal discharge.  Posterior  pharynx with cobblestoning.  Normal tympanic membranes bilaterally.  Mild cervical lymphadenopathy bilaterally. Lungs: Normal work of breathing.  Coarse breath sounds bilaterally with prolonged expiratory phase. Heart: RRR no MRG Abd: NABS, Soft. Nondistended, Nontender Exts: Brisk capillary refill, warm and well perfused.    Lab and Radiology Results Two-view chest x-ray images personally independently Bronchitic changes.  Mild cardiomegaly present.  No acute infiltrate. Await formal radiology review   Assessment and Plan: 23 y.o. male with cough congestion very likely viral URI with bronchitis.  To maximize over-the-counter medications.  Additionally will continue Tessalon Perles for cough suppression.  Use albuterol for wheezing and shortness of breath.  I am concerned about Jakin's ability to coordinate a traditional albuterol inhaler and will prescribe pro-air respiclick.  Additionally a short course of steroids.  And will use hydrocodone-based cough syrup.  Recheck if not improving.  Anxiety and obsessive-compulsive symptoms: Management with psychiatry.  Started Lexapro seems to be doing reasonably well.  Disability: In my professional medical opinion I believe that Cody is completely disabled.  He can complete simple tasks however requires lots of direction and assistance.  He has difficulty with interpersonal interactions.  I do not believe that he will be able to maintain a full-time job sufficient for employment.  He has never had a job in his entire adult life and has not been able to complete job like activity such as  volunteering.Marland Kitchen   PDMP reviewed during this encounter. Orders Placed This Encounter  Procedures  . DG Chest 2 View    Order Specific Question:   Reason for exam:    Answer:   Cough, assess intra-thoracic pathology    Order Specific Question:   Preferred imaging location?    Answer:   [facility]   Meds ordered this encounter  Medications  .  Albuterol Sulfate (PROAIR RESPICLICK) 108 (90 Base) MCG/ACT AEPB    Sig: Inhale 1-2 puffs into the lungs every 4 (four) hours as needed.    Dispense:  1 each    Refill:  1  . chlorpheniramine-HYDROcodone (TUSSIONEX) 10-8 MG/5ML SUER    Sig: Take 5 mLs by mouth every 12 (twelve) hours as needed for cough (cough, will cause drowsiness.).    Dispense:  120 mL    Refill:  0  . predniSONE (DELTASONE) 10 MG tablet    Sig: Take 3 tablets (30 mg total) by mouth daily with breakfast.    Dispense:  15 tablet    Refill:  0     Historical information moved to improve visibility of documentation.  Past Medical History:  Diagnosis Date  . 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 ADD (attention deficit disorder)   . GAD (generalized anxiety disorder) 02/08/2019  . History of impacted cerumen 02/11/2018  . Obesity   . Obsessive-compulsive behavior 02/08/2019  . Sleep disturbance, unspecified    Past Surgical History:  Procedure Laterality Date  . TYMPANOSTOMY TUBE PLACEMENT     Social History   Tobacco Use  . Smoking status: Never Smoker  . Smokeless tobacco: Never Used  Substance Use Topics  . Alcohol use: No   family history includes Diabetes in his father; Fibromyalgia in his mother.  Medications: Current Outpatient Medications  Medication Sig Dispense Refill  . amphetamine-dextroamphetamine (ADDERALL) 30 MG tablet Take 1 tablet by mouth 2 (two) times daily. 180 tablet 0  . benzonatate (TESSALON) 200 MG capsule Take 1 capsule (200 mg total) by mouth 3 (three) times daily as needed for cough. 45 capsule 0  . chlorpheniramine-HYDROcodone (TUSSIONEX) 10-8 MG/5ML SUER Take 5 mLs by mouth every 12 (twelve) hours as needed for cough (cough, will cause drowsiness.). 120 mL 0  . cloNIDine (CATAPRES) 0.2 MG tablet TAKE 4 TABLETS (0.8 MG TOTAL) BY MOUTH AT BEDTIME. 360 tablet 1  . escitalopram (LEXAPRO) 10 MG tablet Take 1 tablet (10 mg total) by  mouth daily. 30 tablet 0  . predniSONE (DELTASONE) 10 MG tablet Take 3 tablets (30 mg total) by mouth daily with breakfast. 15 tablet 0  . Albuterol Sulfate (PROAIR RESPICLICK) 108 (90 Base) MCG/ACT AEPB Inhale 1-2 puffs into the lungs every 4 (four) hours as needed. 1 each 1   No current facility-administered medications for this visit.    No Known Allergies   Discussed warning signs or symptoms. Please see discharge instructions. Patient expresses understanding.

## 2019-02-08 NOTE — Patient Instructions (Signed)
Thank you for coming in today. Get xray now.  Use cough medicine over the counter and tessalon.  Use the albuterol inhaler as needed for shortness of breath or cough or wheezing.  Take prednisone for 5 days.   Recheck as needed.   Call or go to the emergency room if you get worse, have trouble breathing, have chest pains, or palpitations.    Acute Bronchitis, Adult  Acute bronchitis is sudden (acute) swelling of the air tubes (bronchi) in the lungs. Acute bronchitis causes these tubes to fill with mucus, which can make it hard to breathe. It can also cause coughing or wheezing. In adults, acute bronchitis usually goes away within 2 weeks. A cough caused by bronchitis may last up to 3 weeks. Smoking, allergies, and asthma can make the condition worse. Repeated episodes of bronchitis may cause further lung problems, such as chronic obstructive pulmonary disease (COPD). What are the causes? This condition can be caused by germs and by substances that irritate the lungs, including:  Cold and flu viruses. This condition is most often caused by the same virus that causes a cold.  Bacteria.  Exposure to tobacco smoke, dust, fumes, and air pollution. What increases the risk? This condition is more likely to develop in people who:  Have close contact with someone with acute bronchitis.  Are exposed to lung irritants, such as tobacco smoke, dust, fumes, and vapors.  Have a weak immune system.  Have a respiratory condition such as asthma. What are the signs or symptoms? Symptoms of this condition include:  A cough.  Coughing up clear, yellow, or green mucus.  Wheezing.  Chest congestion.  Shortness of breath.  A fever.  Body aches.  Chills.  A sore throat. How is this diagnosed? This condition is usually diagnosed with a physical exam. During the exam, your health care provider may order tests, such as chest X-rays, to rule out other conditions. He or she may also:  Test  a sample of your mucus for bacterial infection.  Check the level of oxygen in your blood. This is done to check for pneumonia.  Do a chest X-ray or lung function testing to rule out pneumonia and other conditions.  Perform blood tests. Your health care provider will also ask about your symptoms and medical history. How is this treated? Most cases of acute bronchitis clear up over time without treatment. Your health care provider may recommend:  Drinking more fluids. Drinking more makes your mucus thinner, which may make it easier to breathe.  Taking a medicine for a fever or cough.  Taking an antibiotic medicine.  Using an inhaler to help improve shortness of breath and to control a cough.  Using a cool mist vaporizer or humidifier to make it easier to breathe. Follow these instructions at home: Medicines  Take over-the-counter and prescription medicines only as told by your health care provider.  If you were prescribed an antibiotic, take it as told by your health care provider. Do not stop taking the antibiotic even if you start to feel better. General instructions   Get plenty of rest.  Drink enough fluids to keep your urine pale yellow.  Avoid smoking and secondhand smoke. Exposure to cigarette smoke or irritating chemicals will make bronchitis worse. If you smoke and you need help quitting, ask your health care provider. Quitting smoking will help your lungs heal faster.  Use an inhaler, cool mist vaporizer, or humidifier as told by your health care provider.  Keep  all follow-up visits as told by your health care provider. This is important. How is this prevented? To lower your risk of getting this condition again:  Wash your hands often with soap and water. If soap and water are not available, use hand sanitizer.  Avoid contact with people who have cold symptoms.  Try not to touch your hands to your mouth, nose, or eyes.  Make sure to get the flu shot every  year. Contact a health care provider if:  Your symptoms do not improve in 2 weeks of treatment. Get help right away if:  You cough up blood.  You have chest pain.  You have severe shortness of breath.  You become dehydrated.  You faint or keep feeling like you are going to faint.  You keep vomiting.  You have a severe headache.  Your fever or chills gets worse. This information is not intended to replace advice given to you by your health care provider. Make sure you discuss any questions you have with your health care provider. Document Released: 01/07/2005 Document Revised: 07/14/2017 Document Reviewed: 05/20/2016 Elsevier Interactive Patient Education  2019 [employer].

## 2019-02-13 ENCOUNTER — Ambulatory Visit (HOSPITAL_COMMUNITY): Payer: BLUE CROSS/BLUE SHIELD | Admitting: Psychiatry

## 2019-02-13 ENCOUNTER — Encounter: Payer: Self-pay | Admitting: Family Medicine

## 2019-02-14 ENCOUNTER — Other Ambulatory Visit (HOSPITAL_COMMUNITY): Payer: Self-pay | Admitting: Psychiatry

## 2019-02-16 ENCOUNTER — Ambulatory Visit (INDEPENDENT_AMBULATORY_CARE_PROVIDER_SITE_OTHER): Payer: BLUE CROSS/BLUE SHIELD | Admitting: Psychiatry

## 2019-02-16 ENCOUNTER — Encounter (HOSPITAL_COMMUNITY): Payer: Self-pay | Admitting: Psychiatry

## 2019-02-16 ENCOUNTER — Other Ambulatory Visit: Payer: Self-pay

## 2019-02-16 VITALS — BP 120/82 | HR 86 | Ht 72.0 in | Wt 284.0 lb

## 2019-02-16 DIAGNOSIS — F5102 Adjustment insomnia: Secondary | ICD-10-CM

## 2019-02-16 DIAGNOSIS — F900 Attention-deficit hyperactivity disorder, predominantly inattentive type: Secondary | ICD-10-CM

## 2019-02-16 DIAGNOSIS — F840 Autistic disorder: Secondary | ICD-10-CM | POA: Diagnosis not present

## 2019-02-16 DIAGNOSIS — F422 Mixed obsessional thoughts and acts: Secondary | ICD-10-CM

## 2019-02-16 DIAGNOSIS — F411 Generalized anxiety disorder: Secondary | ICD-10-CM | POA: Diagnosis not present

## 2019-02-16 MED ORDER — ESCITALOPRAM OXALATE 10 MG PO TABS: 10.0000 mg | ORAL_TABLET | Freq: Every day | ORAL | 0 refills | Status: DC

## 2019-02-16 NOTE — Progress Notes (Signed)
[facility] Outpatient Follow up visit   Patient Identification: Cody Harvey MRN:  [id number] Date of Evaluation:  02/16/2019 Referral Source: Dr. Denyse Amass Chief Complaint:   Chief Complaint    Follow-up; Other     Visit Diagnosis:    ICD-10-CM   1. Adjustment insomnia F51.02   2. Attention deficit hyperactivity disorder (ADHD), predominantly inattentive type F90.0   3. GAD (generalized anxiety disorder) F41.1   4. 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 Mixed obsessional thoughts and acts F42.2     History of Present Illness:  23 years old single white male  With her mom. Referred initially  for management of possible mood symptoms and adjustment to his condition of autistic disorder.  Last visit recommended to cut down adderall as he suffers from anxiety . Now at half dose.  lexapro just started, no side effects  had flu so mom feels difficult to know its effect as worried about recovery  Mostly plays disney games Denies hopelessness no suicidal thoughts   Parents have kept him away from people or job as due to underlying condition and so that he does not get disruptive or more decompensated.  No trauma No drug use   Modifying factor": parents Aggravating Factor: gets upset easily with disruptions, change in routine or around people/crowds Over analyze worries   Past Psychiatric History: ADHD, autism  Previous Psychotropic Medications: No   Substance Abuse History in the last 12 months:  No.  Consequences of Substance Abuse: NA  Past Medical History:  Past Medical History:  Diagnosis Date  . 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 ADD (attention deficit disorder)   . GAD (generalized anxiety disorder) 02/08/2019  . History of impacted cerumen 02/11/2018  . Obesity   . Obsessive-compulsive behavior 02/08/2019  . Sleep disturbance, unspecified     Past Surgical History:  Procedure Laterality Date  . TYMPANOSTOMY TUBE PLACEMENT       Family Psychiatric History: mother fibromyalgia  Family History:  Family History  Problem Relation Age of Onset  . Fibromyalgia Mother   . Diabetes Father     Social History:   Social History   Socioeconomic History  . Marital status: Single    Spouse name: Not on file  . Number of children: Not on file  . Years of education: Not on file  . Highest education level: Not on file  Occupational History  . Not on file  Social Needs  . Financial resource strain: Not on file  . Food insecurity:    Worry: Not on file    Inability: Not on file  . Transportation needs:    Medical: Not on file    Non-medical: Not on file  Tobacco Use  . Smoking status: Never Smoker  . Smokeless tobacco: Never Used  Substance and Sexual Activity  . Alcohol use: No  . Drug use: No  . Sexual activity: Not on file  Lifestyle  . Physical activity:    Days per week: Not on file    Minutes per session: Not on file  . Stress: Not on file  Relationships  . Social connections:    Talks on phone: Not on file    Gets together: Not on file    Attends religious service: Not on file    Active member of club or organization: Not on file    Attends meetings of clubs or organizations: Not on file  Relationship status: Not on file  Other Topics Concern  . Not on file  Social History Narrative  . Not on file    Additional Social History: grew up with parents.  Supportive parents he went through high school and regular but sometimes had to have a Geophysicist/field seismologist for special education Lives with his parents.  Allergies:  No Known Allergies  Metabolic Disorder Labs: Lab Results  Component Value Date   HGBA1C 5.4 05/23/2018   MPG 108 05/23/2018   No results found for: PROLACTIN Lab Results  Component Value Date   CHOL 198 05/23/2018   TRIG 161 (H) 05/23/2018   HDL 36 (L) 05/23/2018   CHOLHDL 5.5 (H) 05/23/2018   VLDL 23 09/20/2014   LDLCALC 132 (H) 05/23/2018   LDLCALC 115 (H)  09/20/2014   Lab Results  Component Value Date   TSH 2.04 05/23/2018    Therapeutic Level Labs: No results found for: LITHIUM No results found for: CBMZ No results found for: VALPROATE  Current Medications: Current Outpatient Medications  Medication Sig Dispense Refill  . Albuterol Sulfate (PROAIR RESPICLICK) 108 (90 Base) MCG/ACT AEPB Inhale 1-2 puffs into the lungs every 4 (four) hours as needed. 1 each 1  . amphetamine-dextroamphetamine (ADDERALL) 30 MG tablet Take 1 tablet by mouth 2 (two) times daily. 180 tablet 0  . chlorpheniramine-HYDROcodone (TUSSIONEX) 10-8 MG/5ML SUER Take 5 mLs by mouth every 12 (twelve) hours as needed for cough (cough, will cause drowsiness.). 120 mL 0  . cloNIDine (CATAPRES) 0.2 MG tablet TAKE 4 TABLETS (0.8 MG TOTAL) BY MOUTH AT BEDTIME. 360 tablet 1  . escitalopram (LEXAPRO) 10 MG tablet Take 1 tablet (10 mg total) by mouth daily. 30 tablet 0  . benzonatate (TESSALON) 200 MG capsule Take 1 capsule (200 mg total) by mouth 3 (three) times daily as needed for cough. (Patient not taking: Reported on 02/16/2019) 45 capsule 0  . predniSONE (DELTASONE) 10 MG tablet Take 3 tablets (30 mg total) by mouth daily with breakfast. (Patient not taking: Reported on 02/16/2019) 15 tablet 0   No current facility-administered medications for this visit.       Psychiatric Specialty Exam: Review of Systems  Cardiovascular: Negative for chest pain and palpitations.  Skin: Negative for rash.  Neurological: Negative for tremors.  Psychiatric/Behavioral: Negative for substance abuse.    Blood pressure 120/82, pulse 86, height 6' (1.829 m), weight 284 lb (128.8 kg).Body mass index is 38.52 kg/m.  General Appearance: Casual  Eye Contact:  Fair  Speech:  Normal Rate  Volume:  Normal  Mood:  Somewhat anxious  Affect:  Congruent  Thought Process:  Coherent but gents entangled in worries  Orientation:  Full (Time, Place, and Person)  Thought Content:  Rumination  Suicidal  Thoughts:  No  Homicidal Thoughts:  No  Memory:  Immediate;   Fair Recent;   Fair  Judgement:  Fair  Insight:  Shallow  Psychomotor Activity:  Normal  Concentration:  Concentration: Fair and Attention Span: Fair  Recall:  [employer] of Knowledge:Fair  Language: Fair  Akathisia:  No  Handed:  Right  AIMS (if indicated):  not done  Assets:  Social Support  ADL's:  Intact  Cognition: WNL  Sleep:  Fair   Screenings: GAD-7     Office Visit from [facility]/19/2019 [facility] [place]  Total GAD-7 Score  7    PHQ2-9     Office Visit from 01/19/2019 in [facility]   Office Visit from 09/13/2017 in [facility] Office Visit from 04/06/2017 in [facility]  PHQ-2 Total Score  0  0  0      Assessment and Plan: as follows  Autism Spectrum: baseline, keeps in routine Will refer him to therapy.  To work on Pharmacologist   GAD with OCD traits: not worse, lexapro just started will keep current dose for now, continue to work on lowering adderall  ADHD:  On adderallm, working on lowering dose for its effect on anxiety More than 50% time spent in counseling coordination of care including patient education reviewed side effects and concerns were addressed  Fu 6 w or earlier if needed.  Thresa Ross, MD 3/5/20201:12 PM

## 2019-04-04 ENCOUNTER — Telehealth: Payer: Self-pay | Admitting: Family Medicine

## 2019-04-04 DIAGNOSIS — F5101 Primary insomnia: Secondary | ICD-10-CM

## 2019-04-04 DIAGNOSIS — F900 Attention-deficit hyperactivity disorder, predominantly inattentive type: Secondary | ICD-10-CM

## 2019-04-04 MED ORDER — AMPHETAMINE-DEXTROAMPHETAMINE 30 MG PO TABS: 30.0000 mg | ORAL_TABLET | Freq: Two times a day (BID) | ORAL | 0 refills | Status: DC

## 2019-04-04 MED ORDER — CLONIDINE HCL 0.2 MG PO TABS: 0.8000 mg | ORAL_TABLET | Freq: Every day | ORAL | 1 refills | Status: DC

## 2019-04-04 NOTE — Telephone Encounter (Signed)
Medication: Amphetamine-Dextroamphetamine 30MG  Tablets  Key: [id number]  COVER[employer]MEDS

## 2019-04-04 NOTE — Telephone Encounter (Signed)
Refilled Clonidine and Adderall.  Will schedule video visit in the near future.   PDMP reviewed during this encounter.

## 2019-04-05 NOTE — Telephone Encounter (Signed)
Information has been sent to insurance and waiting on a response.   

## 2019-04-05 NOTE — Telephone Encounter (Signed)
Approved today (adderall) CaseId:[id number];Status:Approved;Review Type:Prior Auth;Coverage Start Date:03/06/2019;Coverage End Date:04/04/2020; Pharmacy aware.

## 2019-04-10 ENCOUNTER — Telehealth: Payer: Self-pay

## 2019-04-10 DIAGNOSIS — F900 Attention-deficit hyperactivity disorder, predominantly inattentive type: Secondary | ICD-10-CM

## 2019-04-10 MED ORDER — AMPHETAMINE-DEXTROAMPHETAMINE 20 MG PO TABS: 20.0000 mg | ORAL_TABLET | Freq: Two times a day (BID) | ORAL | 0 refills | Status: DC

## 2019-04-10 NOTE — Telephone Encounter (Signed)
[name] called and states she only picked up 20 tablet of the Adderall 30 mg because he needed them and a PA had to be done. She also states the Adderall should have been decreased to 20 mg twice daily. I did advise if the dose is changed another PA will be generated.

## 2019-04-10 NOTE — Telephone Encounter (Signed)
Adderall prescription clarified to 20 mg twice daily.  New prescription sent to pharmacy.

## 2019-04-11 NOTE — Telephone Encounter (Signed)
Pharmacy received new Rx and is working on it now

## 2019-04-15 ENCOUNTER — Other Ambulatory Visit (HOSPITAL_COMMUNITY): Payer: Self-pay | Admitting: Psychiatry

## 2019-05-09 ENCOUNTER — Other Ambulatory Visit (HOSPITAL_COMMUNITY): Payer: Self-pay | Admitting: Psychiatry

## 2019-06-23 ENCOUNTER — Ambulatory Visit (INDEPENDENT_AMBULATORY_CARE_PROVIDER_SITE_OTHER): Payer: BC Managed Care – PPO | Admitting: Family Medicine

## 2019-06-23 VITALS — BP 120/65 | Temp 98.9°F | Wt 269.0 lb

## 2019-06-23 DIAGNOSIS — R4681 Obsessive-compulsive behavior: Secondary | ICD-10-CM | POA: Diagnosis not present

## 2019-06-23 DIAGNOSIS — F411 Generalized anxiety disorder: Secondary | ICD-10-CM

## 2019-06-23 DIAGNOSIS — F840 Autistic disorder: Secondary | ICD-10-CM | POA: Diagnosis not present

## 2019-06-23 DIAGNOSIS — F900 Attention-deficit hyperactivity disorder, predominantly inattentive type: Secondary | ICD-10-CM

## 2019-06-23 MED ORDER — AMPHETAMINE-DEXTROAMPHETAMINE 30 MG PO TABS: 30.0000 mg | ORAL_TABLET | Freq: Two times a day (BID) | ORAL | 0 refills | Status: DC

## 2019-06-23 NOTE — Progress Notes (Signed)
Virtual Visit  I connected with      Cody Harvey  by a telemedicine application and verified that I am speaking with the correct person using two identifiers.   I discussed the limitations of evaluation and management by telemedicine and the availability of in person appointments. The patient expressed understanding and agreed to proceed.  History of Present Illness: Cody Harvey is a 23 y.o. male who would like to discuss ADHD and anxiety.  Patient previously had ADHD controlled with Adderall 30 twice daily and clonidine at night.  He was seen by psychiatry for his anxiety and as well as his ADHD/autism.  He started Lexapro which he did not tolerate.  Lexapro caused nausea and vomiting he stopped it.  He notes his Adderall was decreased to 20.  He is feeling anxiety is pretty controlled but both the patient and his mother think that his ADHD was better controlled on Adderall 30.  They like to go back up if possible.  Clonidine is still working quite well and allowing him to sleep at bedtime.   Observations/Objective: BP 120/65   Temp 98.9 F (37.2 C) (Oral)   Wt 269 lb (122 kg)   BMI 36.48 kg/m  Wt Readings from Last 5 Encounters:  06/23/19 269 lb (122 kg)  02/08/19 283 lb (128.4 kg)  12/09/18 280 lb (127 kg)  08/24/18 276 lb (125.2 kg)  05/23/18 267 lb (121.1 kg)   Exam: Normal Speech.  No shortness of breath.  Lab and Radiology Results No results found for this or any previous visit (from the past 72 hour(s)). No results found.   Assessment and Plan: [age] y.o. male with ADHD.  Plan to increase Adderall dose back to 30 twice daily.  Continue to follow anxiety symptoms.  If worsening may be reasonable to use a different SSRI.  Continue clonidine.  Recheck 3 months.  PDMP reviewed during this encounter. No orders of the defined types were placed in this encounter.  Meds ordered this encounter  Medications  . amphetamine-dextroamphetamine (ADDERALL) 30 MG tablet   Sig: Take 1 tablet by mouth 2 (two) times daily.    Dispense:  180 tablet    Refill:  0    Follow Up Instructions:    I discussed the assessment and treatment plan with the patient. The patient was provided an opportunity to ask questions and all were answered. The patient agreed with the plan and demonstrated an understanding of the instructions.   The patient was advised to call back or seek an in-person evaluation if the symptoms worsen or if the condition fails to improve as anticipated.  Time: 15 minutes of intraservice time, with >22 minutes of total time during today's visit.      Historical information moved to improve visibility of documentation.  Past Medical History:  Diagnosis Date  . 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 ADD (attention deficit disorder)   . GAD (generalized anxiety disorder) 02/08/2019  . History of impacted cerumen 02/11/2018  . Obesity   . Obsessive-compulsive behavior 02/08/2019  . Sleep disturbance, unspecified    Past Surgical History:  Procedure Laterality Date  . TYMPANOSTOMY TUBE PLACEMENT     Social History   Tobacco Use  . Smoking status: Never Smoker  . Smokeless tobacco: Never Used  Substance Use Topics  . Alcohol use: No   family history includes Diabetes in his father; Fibromyalgia in his mother.  Medications: Current Outpatient Medications  Medication Sig Dispense Refill  . cloNIDine (CATAPRES) 0.2 MG tablet Take 4 tablets (0.8 mg total) by mouth at bedtime. 360 tablet 1  . amphetamine-dextroamphetamine (ADDERALL) 30 MG tablet Take 1 tablet by mouth 2 (two) times daily. 180 tablet 0   No current facility-administered medications for this visit.    No Known Allergies

## 2019-06-23 NOTE — Progress Notes (Signed)
Wants to discuss increasing Adderall back to 30 mg.

## 2019-07-12 ENCOUNTER — Telehealth: Payer: Self-pay | Admitting: Family Medicine

## 2019-07-12 DIAGNOSIS — E559 Vitamin D deficiency, unspecified: Secondary | ICD-10-CM | POA: Insufficient documentation

## 2019-07-12 DIAGNOSIS — E6609 Other obesity due to excess calories: Secondary | ICD-10-CM

## 2019-07-12 DIAGNOSIS — R739 Hyperglycemia, unspecified: Secondary | ICD-10-CM

## 2019-07-12 DIAGNOSIS — E785 Hyperlipidemia, unspecified: Secondary | ICD-10-CM

## 2019-07-12 MED ORDER — HYDROCODONE-HOMATROPINE 5-1.5 MG/5ML PO SYRP: 5.0000 mL | ORAL_SOLUTION | Freq: Three times a day (TID) | ORAL | 0 refills | Status: DC | PRN

## 2019-07-12 MED ORDER — HYDROCOD POLST-CPM POLST ER 10-8 MG/5ML PO SUER: 5.0000 mL | Freq: Two times a day (BID) | ORAL | 0 refills | Status: DC | PRN

## 2019-07-12 NOTE — Telephone Encounter (Signed)
Mom called.  Cody Harvey continues to have intermintant cough and uses hycodan sparingly.  Will get updated annual labs and schedule nurse visit for Tdap

## 2019-08-28 ENCOUNTER — Other Ambulatory Visit: Payer: Self-pay

## 2019-08-28 ENCOUNTER — Ambulatory Visit (INDEPENDENT_AMBULATORY_CARE_PROVIDER_SITE_OTHER): Payer: BC Managed Care – PPO | Admitting: Family Medicine

## 2019-08-28 ENCOUNTER — Encounter: Payer: Self-pay | Admitting: Family Medicine

## 2019-08-28 ENCOUNTER — Telehealth: Payer: Self-pay | Admitting: Family Medicine

## 2019-08-28 VITALS — BP 103/71 | HR 70 | Wt 293.0 lb

## 2019-08-28 DIAGNOSIS — Z23 Encounter for immunization: Secondary | ICD-10-CM | POA: Diagnosis not present

## 2019-08-28 DIAGNOSIS — Z6839 Body mass index (BMI) 39.0-39.9, adult: Secondary | ICD-10-CM

## 2019-08-28 DIAGNOSIS — F840 Autistic disorder: Secondary | ICD-10-CM

## 2019-08-28 DIAGNOSIS — F900 Attention-deficit hyperactivity disorder, predominantly inattentive type: Secondary | ICD-10-CM

## 2019-08-28 DIAGNOSIS — E6609 Other obesity due to excess calories: Secondary | ICD-10-CM

## 2019-08-28 MED ORDER — PREDNISONE 10 MG PO TABS: 30.0000 mg | ORAL_TABLET | Freq: Every day | ORAL | 0 refills | Status: DC

## 2019-08-28 MED ORDER — HYDROCOD POLST-CPM POLST ER 10-8 MG/5ML PO SUER: 5.0000 mL | Freq: Two times a day (BID) | ORAL | 0 refills | Status: DC | PRN

## 2019-08-28 NOTE — Progress Notes (Signed)
Cody Harvey is a 23 y.o. male who [facility] today for follow-up of ADHD obesity, autism, and occasional cough.  ADHD: Doing well on Adderall and at night clonidine.  This regimen has been stable for quite some time with no issues.  He does have an occasional intermittent cough that typically worsens this time a year.  His mom notes that I am transitioning away from sports medicine and she wants a backup prescription of prednisone and cough medicine for use if worsening this winter or fall.    Obesity: Patient has gained some weight.  He does try to exercise some.  ROS as above:  Exam:  BP 103/71   Pulse 70   Wt 293 lb (132.9 kg)   SpO2 99%   BMI 39.74 kg/m  Wt Readings from Last 5 Encounters:  08/28/19 293 lb (132.9 kg)  06/23/19 269 lb (122 kg)  02/08/19 283 lb (128.4 kg)  12/09/18 280 lb (127 kg)  08/24/18 276 lb (125.2 kg)    Gen: Well NAD HEENT: EOMI,  MMM Lungs: Normal work of breathing. CTABL Heart: RRR no MRG Abd: NABS, Soft. Nondistended, Nontender Exts: Brisk capillary refill, warm and well perfused.     Assessment and Plan: 23 y.o. male with  ADHD: Doing well.  Continue current regimen of Adderall and bedtime clonidine.  Recheck in 6 months.  Fasting labs ordered in July will be done shortly to follow-up his chronic medical problems including obesity.  Printed back up prednisone and cough medication for use this fall if needed.  Ministered flu and Tdap vaccines today.  PDMP reviewed during this encounter. Orders Placed This Encounter  Procedures  . Flu Vaccine QUAD 6+ mos PF IM (Fluarix Quad PF)  . Tdap vaccine greater than or equal to 7yo IM   Meds ordered this encounter  Medications  . predniSONE (DELTASONE) 10 MG tablet    Sig: Take 3 tablets (30 mg total) by mouth daily with breakfast.    Dispense:  15 tablet     Refill:  0  . chlorpheniramine-HYDROcodone (TUSSIONEX) 10-8 MG/5ML SUER    Sig: Take 5 mLs by mouth every 12 (twelve) hours as needed for cough (cough, will cause drowsiness.).    Dispense:  120 mL    Refill:  0     Historical information moved to improve visibility of documentation.  Past Medical History:  Diagnosis Date  . 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 ADD (attention deficit disorder)   . GAD (generalized anxiety disorder) 02/08/2019  . History of impacted cerumen 02/11/2018  . Obesity   . Obsessive-compulsive behavior 02/08/2019  . Sleep disturbance, unspecified    Past Surgical History:  Procedure Laterality Date  . TYMPANOSTOMY TUBE PLACEMENT     Social History   Tobacco Use  . Smoking status: Never Smoker  . Smokeless tobacco: Never Used  Substance Use Topics  . Alcohol use: No   family history includes Diabetes in his father; Fibromyalgia in his mother.  Medications: Current Outpatient Medications  Medication Sig Dispense Refill  . amphetamine-dextroamphetamine (ADDERALL) 30 MG tablet Take 1 tablet by mouth 2 (two) times daily. 180 tablet 0  . chlorpheniramine-HYDROcodone (TUSSIONEX) 10-8 MG/5ML SUER Take 5 mLs by mouth every 12 (twelve) hours as needed for cough (cough, will cause drowsiness.). 120 mL 0  . cloNIDine (CATAPRES) 0.2 MG tablet Take  4 tablets (0.8 mg total) by mouth at bedtime. 360 tablet 1  . predniSONE (DELTASONE) 10 MG tablet Take 3 tablets (30 mg total) by mouth daily with breakfast. 15 tablet 0   No current facility-administered medications for this visit.    No Known Allergies   Discussed warning signs or symptoms. Please see discharge instructions. Patient expresses understanding.

## 2019-08-28 NOTE — Telephone Encounter (Signed)
Pt's mother advised. 

## 2019-08-28 NOTE — Patient Instructions (Signed)
Thank you for coming in today. Get fasting labs in the near future. Request refill of adderll when due in October.   Return in 6 months for recheck with Jade.

## 2019-08-28 NOTE — Telephone Encounter (Signed)
Labs ordered already.  Ready to be done.

## 2019-08-28 NOTE — Telephone Encounter (Signed)
Mother called in wanting to know if you can send labs downstairs before appointment for today. States that they are going to try to go at 1:30pm before appointment. Thank you.

## 2019-09-15 ENCOUNTER — Telehealth: Payer: Self-pay | Admitting: Family Medicine

## 2019-09-15 MED ORDER — AMPHETAMINE-DEXTROAMPHETAMINE 30 MG PO TABS: 30.0000 mg | ORAL_TABLET | Freq: Two times a day (BID) | ORAL | 0 refills | Status: DC

## 2019-09-15 NOTE — Telephone Encounter (Signed)
Mom calls and would like a early ( 7 day ) refill on his Adderall. She was letting him handle his meds some and he misused it so she is taking that responsibility away from him again. She has spoke with pharmacy and they are ok with doing it. Please advise. They have meetings with attorney and disability coming up and he focuses better on this med and will need it. KG LPN

## 2019-09-15 NOTE — Telephone Encounter (Signed)
Pt's mother advised. 

## 2019-09-15 NOTE — Telephone Encounter (Signed)
Refill sent to pharmacy.   

## 2019-09-16 ENCOUNTER — Other Ambulatory Visit: Payer: Self-pay | Admitting: Family Medicine

## 2019-09-16 DIAGNOSIS — F5101 Primary insomnia: Secondary | ICD-10-CM

## 2019-09-26 LAB — CBC
HCT: 47.1 % (ref 38.5–50.0)
Hemoglobin: 15.9 g/dL (ref 13.2–17.1)
MCH: 27.6 pg (ref 27.0–33.0)
MCHC: 33.8 g/dL (ref 32.0–36.0)
MCV: 81.8 fL (ref 80.0–100.0)
MPV: 9.6 fL (ref 7.5–12.5)
Platelets: 249 10*3/uL (ref 140–400)
RBC: 5.76 10*6/uL (ref 4.20–5.80)
RDW: 12.7 % (ref 11.0–15.0)
WBC: 11 10*3/uL — ABNORMAL HIGH (ref 3.8–10.8)

## 2019-09-26 LAB — COMPLETE METABOLIC PANEL WITH GFR
AG Ratio: 1.6 (calc) (ref 1.0–2.5)
ALT: 20 U/L (ref 9–46)
AST: 18 U/L (ref 10–40)
Albumin: 4.4 g/dL (ref 3.6–5.1)
Alkaline phosphatase (APISO): 64 U/L (ref 36–130)
BUN: 16 mg/dL (ref 7–25)
CO2: 28 mmol/L (ref 20–32)
Calcium: 10 mg/dL (ref 8.6–10.3)
Chloride: 99 mmol/L (ref 98–110)
Creat: 0.95 mg/dL (ref 0.60–1.35)
GFR, Est African American: 128 mL/min/{1.73_m2} (ref >=60)
GFR, Est Non African American: 111 mL/min/{1.73_m2} (ref >=60)
Globulin: 2.7 g/dL (calc) (ref 1.9–3.7)
Glucose, Bld: 85 mg/dL (ref 65–99)
Potassium: 4.2 mmol/L (ref 3.5–5.3)
Sodium: 137 mmol/L (ref 135–146)
Total Bilirubin: 0.6 mg/dL (ref 0.2–1.2)
Total Protein: 7.1 g/dL (ref 6.1–8.1)

## 2019-09-26 LAB — LIPID PANEL W/REFLEX DIRECT LDL
Cholesterol: 205 mg/dL — ABNORMAL HIGH (ref <200)
HDL: 36 mg/dL — ABNORMAL LOW (ref >=40)
LDL Cholesterol (Calc): 135 mg/dL (calc) — ABNORMAL HIGH
Non-HDL Cholesterol (Calc): 169 mg/dL (calc) — ABNORMAL HIGH (ref <130)
Total CHOL/HDL Ratio: 5.7 (calc) — ABNORMAL HIGH (ref <5.0)
Triglycerides: 203 mg/dL — ABNORMAL HIGH (ref <150)

## 2019-09-26 LAB — HEMOGLOBIN A1C
Hgb A1c MFr Bld: 5.4 % of total Hgb (ref <5.7)
Mean Plasma Glucose: 108 (calc)
eAG (mmol/L): 6 (calc)

## 2019-09-26 LAB — VITAMIN D 25 HYDROXY (VIT D DEFICIENCY, FRACTURES): Vit D, 25-Hydroxy: 28 ng/mL — ABNORMAL LOW (ref 30–100)

## 2019-12-13 ENCOUNTER — Ambulatory Visit (INDEPENDENT_AMBULATORY_CARE_PROVIDER_SITE_OTHER): Payer: BC Managed Care – PPO | Admitting: Physician Assistant

## 2019-12-13 ENCOUNTER — Encounter: Payer: Self-pay | Admitting: Physician Assistant

## 2019-12-13 VITALS — Temp 98.6°F | Ht 72.0 in | Wt 293.0 lb

## 2019-12-13 DIAGNOSIS — F840 Autistic disorder: Secondary | ICD-10-CM

## 2019-12-13 DIAGNOSIS — F900 Attention-deficit hyperactivity disorder, predominantly inattentive type: Secondary | ICD-10-CM

## 2019-12-13 MED ORDER — AMPHETAMINE-DEXTROAMPHETAMINE 30 MG PO TABS: 30.0000 mg | ORAL_TABLET | Freq: Two times a day (BID) | ORAL | 0 refills | Status: DC

## 2019-12-13 NOTE — Progress Notes (Signed)
Patient ID: Cody Harvey, male   DOB: 24-Dec-1995, 23 y.o.   MRN: [id number] .Marland KitchenVirtual Visit via Telephone Note  I connected with Cody Berg on 12/13/19 at  7:30 AM EST by telephone and verified that I am speaking with the correct person using two identifiers.  Location: Patient: home Provider: clinic   I discussed the limitations, risks, security and privacy concerns of performing an evaluation and management service by telephone and the availability of in person appointments. I also discussed with the patient that there may be a patient responsible charge related to this service. The patient expressed understanding and agreed to proceed.   History of Present Illness: Pt is a 23 yo male with Autism, ADHD, GAD, insomnia who calls into the clinic for medication refills. He is taking adderall and clonadine. He is doing very well. He is working and sleeping. He denies any concerns or complaints. He would like to get adderall refill a few days early so that it would be cheaper.   .. Active Ambulatory Problems    Diagnosis Date Noted  . 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 Insomnia 04/03/2010  . ADHD (attention deficit hyperactivity disorder) 11/24/2011  . Obesity 03/28/2015  . 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 History of impacted cerumen 02/11/2018  . GAD (generalized anxiety disorder) 02/08/2019  . Obsessive-compulsive behavior 02/08/2019  . Vitamin D deficiency 07/12/2019   Resolved Ambulatory Problems    Diagnosis Date Noted  . adhd 04/03/2010  . OBESITY, CLASS I 04/03/2010   Past Medical History:  Diagnosis Date  . ADD (attention deficit disorder)   . Sleep disturbance, unspecified    Reviewed med, allergy, problem list.     Observations/Objective: No acute distress.   .. Today's Vitals   12/13/19 0708  Temp: 98.6 F (37 C)  TempSrc: Oral  Weight: 293 lb (132.9 kg)  Height: 6' (1.829 m)   Body mass index is 39.74  kg/m.    Assessment and Plan: .Marland KitchenMartinique was seen today for adhd.  Diagnoses and all orders for this visit:  Active autistic disorder  Attention deficit hyperactivity disorder (ADHD), predominantly inattentive type    Refilled medication. Ok to fill early. Follow up in 3 months.   Follow Up Instructions:    I discussed the assessment and treatment plan with the patient. The patient was provided an opportunity to ask questions and all were answered. The patient agreed with the plan and demonstrated an understanding of the instructions.   The patient was advised to call back or seek an in-person evaluation if the symptoms worsen or if the condition fails to improve as anticipated.  I provided 11 minutes of non-face-to-face time during this encounter.   Iran Planas, PA-C

## 2019-12-13 NOTE — Progress Notes (Deleted)
Adderall last filled 09/15/2019 #180 with no refills.  They wanted to fill before the new year since they will not have to pay out of pocket for RX (met deductible/having money issues) No other problems

## 2020-03-05 ENCOUNTER — Other Ambulatory Visit: Payer: Self-pay | Admitting: Family Medicine

## 2020-03-05 DIAGNOSIS — F5101 Primary insomnia: Secondary | ICD-10-CM

## 2020-03-05 NOTE — Telephone Encounter (Signed)
Please clarify right dose.

## 2020-03-11 ENCOUNTER — Other Ambulatory Visit: Payer: Self-pay | Admitting: Neurology

## 2020-03-11 NOTE — Telephone Encounter (Signed)
Patient was seen in December and given a 90-day supply.  Told to follow-up in 3 months.  I do not see any appointment scheduled.  Medication declined for now.

## 2020-03-11 NOTE — Telephone Encounter (Signed)
Patient's mother [facility] asking for refill on patient's Adderall. Last filled 12/13/19 #180 with no refills. Last appt 12/13/2019. Needs appt. Please advise.

## 2020-03-11 NOTE — Telephone Encounter (Signed)
I scheduled pt for a virtual Telephone F/U on Adderall meds 03/12/20 at 09:30 with [facility]

## 2020-03-12 ENCOUNTER — Encounter: Payer: Self-pay | Admitting: Nurse Practitioner

## 2020-03-12 ENCOUNTER — Telehealth: Payer: Self-pay

## 2020-03-12 ENCOUNTER — Telehealth (INDEPENDENT_AMBULATORY_CARE_PROVIDER_SITE_OTHER): Payer: Medicaid Other | Admitting: Nurse Practitioner

## 2020-03-12 VITALS — BP 130/60 | Temp 98.9°F

## 2020-03-12 DIAGNOSIS — F900 Attention-deficit hyperactivity disorder, predominantly inattentive type: Secondary | ICD-10-CM | POA: Diagnosis not present

## 2020-03-12 MED ORDER — AMPHETAMINE-DEXTROAMPHETAMINE 30 MG PO TABS: 30.0000 mg | ORAL_TABLET | Freq: Two times a day (BID) | ORAL | 0 refills | Status: DC

## 2020-03-12 NOTE — Progress Notes (Signed)
Virtual Visit via Telephone Note  I connected with  Cody Harvey on 03/12/20 at  9:30 AM EDT by telephone and verified that I am speaking with the correct person using two identifiers.   I discussed the limitations, risks, security and privacy concerns of performing an evaluation and management service by telephone and the availability of in person appointments. I also discussed with the patient that there may be a patient responsible charge related to this service. The patient expressed understanding and agreed to proceed.  The patient is: at home I am: in the office  Subjective:    CC: ADHD Follow-up and refills  HPI: Patient presents on the telephone accompanied by his mother. Cody has autism and his mother acts on his behalf with medical visits with his permission. Cody did give me permission to speak with his mother about his care today.   Mom says Cody is doing well on his medication at the current dose. He is not having any difficulty with sleep and has a good appetite. Mom reports he has started to walk outside with his father to get some exercise. Cody is happy with the current dose of medication and feels this is effective in treating his ADHD. They would like to continue on the current regimen.   Past medical history, Surgical history, Family history not pertinant except as noted below, Social history, Allergies, and medications have been entered into the medical record, reviewed, and corrections made.   Review of Systems:  See HPI for pertinent positive and negatives.   Objective:    General: Speaking clearly in complete sentences without any shortness of breath.  Alert and oriented x3.  Normal judgment. No apparent acute distress. Patient is accompanied on the phone with his mother and gives permission for his mother to speak on his behalf and make medical decisions for him for this visit.    Impression and Recommendations:    1. Attention deficit hyperactivity  disorder (ADHD), predominantly inattentive type ADHD with inattentive dominance. Patient is reportedly doing well on the current medication dose and regimen.  Refill provided for 3 month supply.  Follow-up in 3 months for ADHD and refills.  - amphetamine-dextroamphetamine (ADDERALL) 30 MG tablet; Take 1 tablet by mouth 2 (two) times daily.  Dispense: 180 tablet; Refill: 0    I discussed the assessment and treatment plan with the patient. The patient was provided an opportunity to ask questions and all were answered. The patient agreed with the plan and demonstrated an understanding of the instructions.   The patient was advised to call back or seek an in-person evaluation if the symptoms worsen or if the condition fails to improve as anticipated.  I provided 8 minutes of non-face-to-face time during this TELEPHONE encounter.    Tollie Eth, NP '

## 2020-03-12 NOTE — Telephone Encounter (Signed)
Cody Harvey's mom called and left a message stating [facility] is out of the Adderall and will not receive any in until Friday. She was hoping she could get another prescription [facility] [place] [address]. She states they are going out of town. Please advise.

## 2020-03-14 ENCOUNTER — Other Ambulatory Visit: Payer: Self-pay | Admitting: Nurse Practitioner

## 2020-03-14 DIAGNOSIS — F900 Attention-deficit hyperactivity disorder, predominantly inattentive type: Secondary | ICD-10-CM

## 2020-03-14 MED ORDER — AMPHETAMINE-DEXTROAMPHETAMINE 30 MG PO TABS: 30.0000 mg | ORAL_TABLET | Freq: Two times a day (BID) | ORAL | 0 refills | Status: DC

## 2020-03-14 NOTE — Telephone Encounter (Signed)
Attempted to call patient's mother, no answer and no VM

## 2020-03-14 NOTE — Telephone Encounter (Signed)
Prescription resent to the [facility] on [address] in [place].

## 2020-04-29 ENCOUNTER — Other Ambulatory Visit: Payer: Self-pay

## 2020-04-29 ENCOUNTER — Encounter: Payer: Self-pay | Admitting: Family Medicine

## 2020-04-29 ENCOUNTER — Ambulatory Visit (INDEPENDENT_AMBULATORY_CARE_PROVIDER_SITE_OTHER): Payer: Medicaid Other | Admitting: Family Medicine

## 2020-04-29 DIAGNOSIS — M654 Radial styloid tenosynovitis [de Quervain]: Secondary | ICD-10-CM | POA: Diagnosis not present

## 2020-04-29 MED ORDER — DICLOFENAC SODIUM 1 % EX GEL: 2.0000 g | Freq: Four times a day (QID) | CUTANEOUS | 0 refills | Status: DC

## 2020-04-29 NOTE — Progress Notes (Signed)
Cody Harvey - 24 y.o. male MRN [id number]  Date of birth: 05/02/1996  Subjective Chief Complaint  Patient presents with  . Wrist Pain    HPI Cody Harvey is a 24 y.o. male here today with complaint of R wrist pain.  Pain is located along ulnar aspect of wrist.  Denies any known injury.  Has tried anti-inflammatories and massage without much improvement.  He has also used a universal wrist splint with mild improvement.  He denies numbness, tingling or weakness into the wrist.  He has not noticed any swelling.   ROS:  A comprehensive ROS was completed and negative except as noted per HPI  No Known Allergies  Past Medical History:  Diagnosis Date  . 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 ADD (attention deficit disorder)   . GAD (generalized anxiety disorder) 02/08/2019  . History of impacted cerumen 02/11/2018  . Obesity   . Obsessive-compulsive behavior 02/08/2019  . Sleep disturbance, unspecified     Past Surgical History:  Procedure Laterality Date  . TYMPANOSTOMY TUBE PLACEMENT      Social History   Socioeconomic History  . Marital status: Single    Spouse name: Not on file  . Number of children: Not on file  . Years of education: Not on file  . Highest education level: Not on file  Occupational History  . Not on file  Tobacco Use  . Smoking status: Never Smoker  . Smokeless tobacco: Never Used  Substance and Sexual Activity  . Alcohol use: No  . Drug use: No  . Sexual activity: Not on file  Other Topics Concern  . Not on file  Social History Narrative  . Not on file   Social Determinants of Health   Financial Resource Strain:   . Difficulty of Paying Living Expenses:   Food Insecurity:   . Worried About Programme researcher, broadcasting/film/video in the Last Year:   . Barista in the Last Year:   Transportation Needs:   . Freight forwarder (Medical):   Marland Kitchen Lack of Transportation (Non-Medical):   Physical Activity:   .  Days of Exercise per Week:   . Minutes of Exercise per Session:   Stress:   . Feeling of Stress :   Social Connections:   . Frequency of Communication with Friends and Family:   . Frequency of Social Gatherings with Friends and Family:   . Attends Religious Services:   . Active Member of Clubs or Organizations:   . Attends Banker Meetings:   Marland Kitchen Marital Status:     Family History  Problem Relation Age of Onset  . Fibromyalgia Mother   . Diabetes Father     Health Maintenance  Topic Date Due  . COVID-19 Vaccine (1) Never done  . HIV Screening  12/12/2020 (Originally 12/25/2008)  . INFLUENZA VACCINE  07/14/2020  . TETANUS/TDAP  08/27/2029     ----------------------------------------------------------------------------------------------------------------------------------------------------------------------------------------------------------------- Physical Exam BP 108/61 (BP Location: Left Arm, Patient Position: Sitting, Cuff Size: Large)   Pulse 87   Ht 6' 0.05" (1.83 m)   Wt 294 lb 4.8 oz (133.5 kg)   SpO2 96%   BMI 39.86 kg/m   Physical Exam Constitutional:      Appearance: Normal appearance.  Musculoskeletal:     Comments: R wrist with normal ROM.   Mild ttp along ulnar aspect of R wrist.   Pain with resisted thumb extension and  abduction +finkelstein test  Skin:    General: Skin is warm and dry.  Neurological:     Mental Status: He is alert.     ------------------------------------------------------------------------------------------------------------------------------------------------------------------------------------------------------------------- Assessment and Plan  De Quervain's tenosynovitis, right Recommend use of thumb spica for 1-2 weeks.  They will pick up at local drug store Icing qid, followed by application of voltaren gel after icing If not improving with this recommend having him follow up with Dr. Darene Lamer to discuss  injection   Meds ordered this encounter  Medications  . diclofenac Sodium (VOLTAREN) 1 % GEL    Sig: Apply 2 g topically 4 (four) times daily.    Dispense:  100 g    Refill:  0    No follow-ups on file.    This visit occurred during the SARS-CoV-2 public health emergency.  Safety protocols were in place, including screening questions prior to the visit, additional usage of staff PPE, and extensive cleaning of exam room while observing appropriate contact time as indicated for disinfecting solutions.

## 2020-04-29 NOTE — Assessment & Plan Note (Signed)
Recommend use of thumb spica for 1-2 weeks.  They will pick up at local drug store Icing qid, followed by application of voltaren gel after icing If not improving with this recommend having him follow up with Dr. Karie Schwalbe to discuss injection

## 2020-04-29 NOTE — Patient Instructions (Addendum)
  Great to meet you today! Try using a thumb spica splint for 1-2 weeks. Use voltaren gel to area 3-4 times per day.  Apply ice to area 3-4 times per day.  If not improving over the next couple of weeks I would recommend scheduling with Dr. Karie Schwalbe to discuss having an injection to area.

## 2020-05-09 ENCOUNTER — Telehealth: Payer: Self-pay | Admitting: Physician Assistant

## 2020-05-09 NOTE — Telephone Encounter (Signed)
Patient called in wanting to speak with you about getting a covid vaccine and which one is the safest for him due to having Autism. He is interested in which vaccine would be the safest. Please advice and thanks.

## 2020-05-10 NOTE — Telephone Encounter (Signed)
Left detailed msg of Pfizer recommendation

## 2020-05-10 NOTE — Telephone Encounter (Signed)
Cody Harvey, referring this to you.

## 2020-05-10 NOTE — Telephone Encounter (Signed)
Pfizer has slightly better efficacy and study and less vaccine type reactions. I would recommend this.

## 2020-05-25 DIAGNOSIS — Z23 Encounter for immunization: Secondary | ICD-10-CM | POA: Diagnosis not present

## 2020-05-30 ENCOUNTER — Other Ambulatory Visit: Payer: Self-pay | Admitting: Family Medicine

## 2020-06-11 IMAGING — DX DG CHEST 2V
2 series · 2 of 2 positions shown · non-contrast
Comparison: None.

CLINICAL DATA: Cough and congestion

EXAM:
CHEST - 2 VIEW

[chest pa]
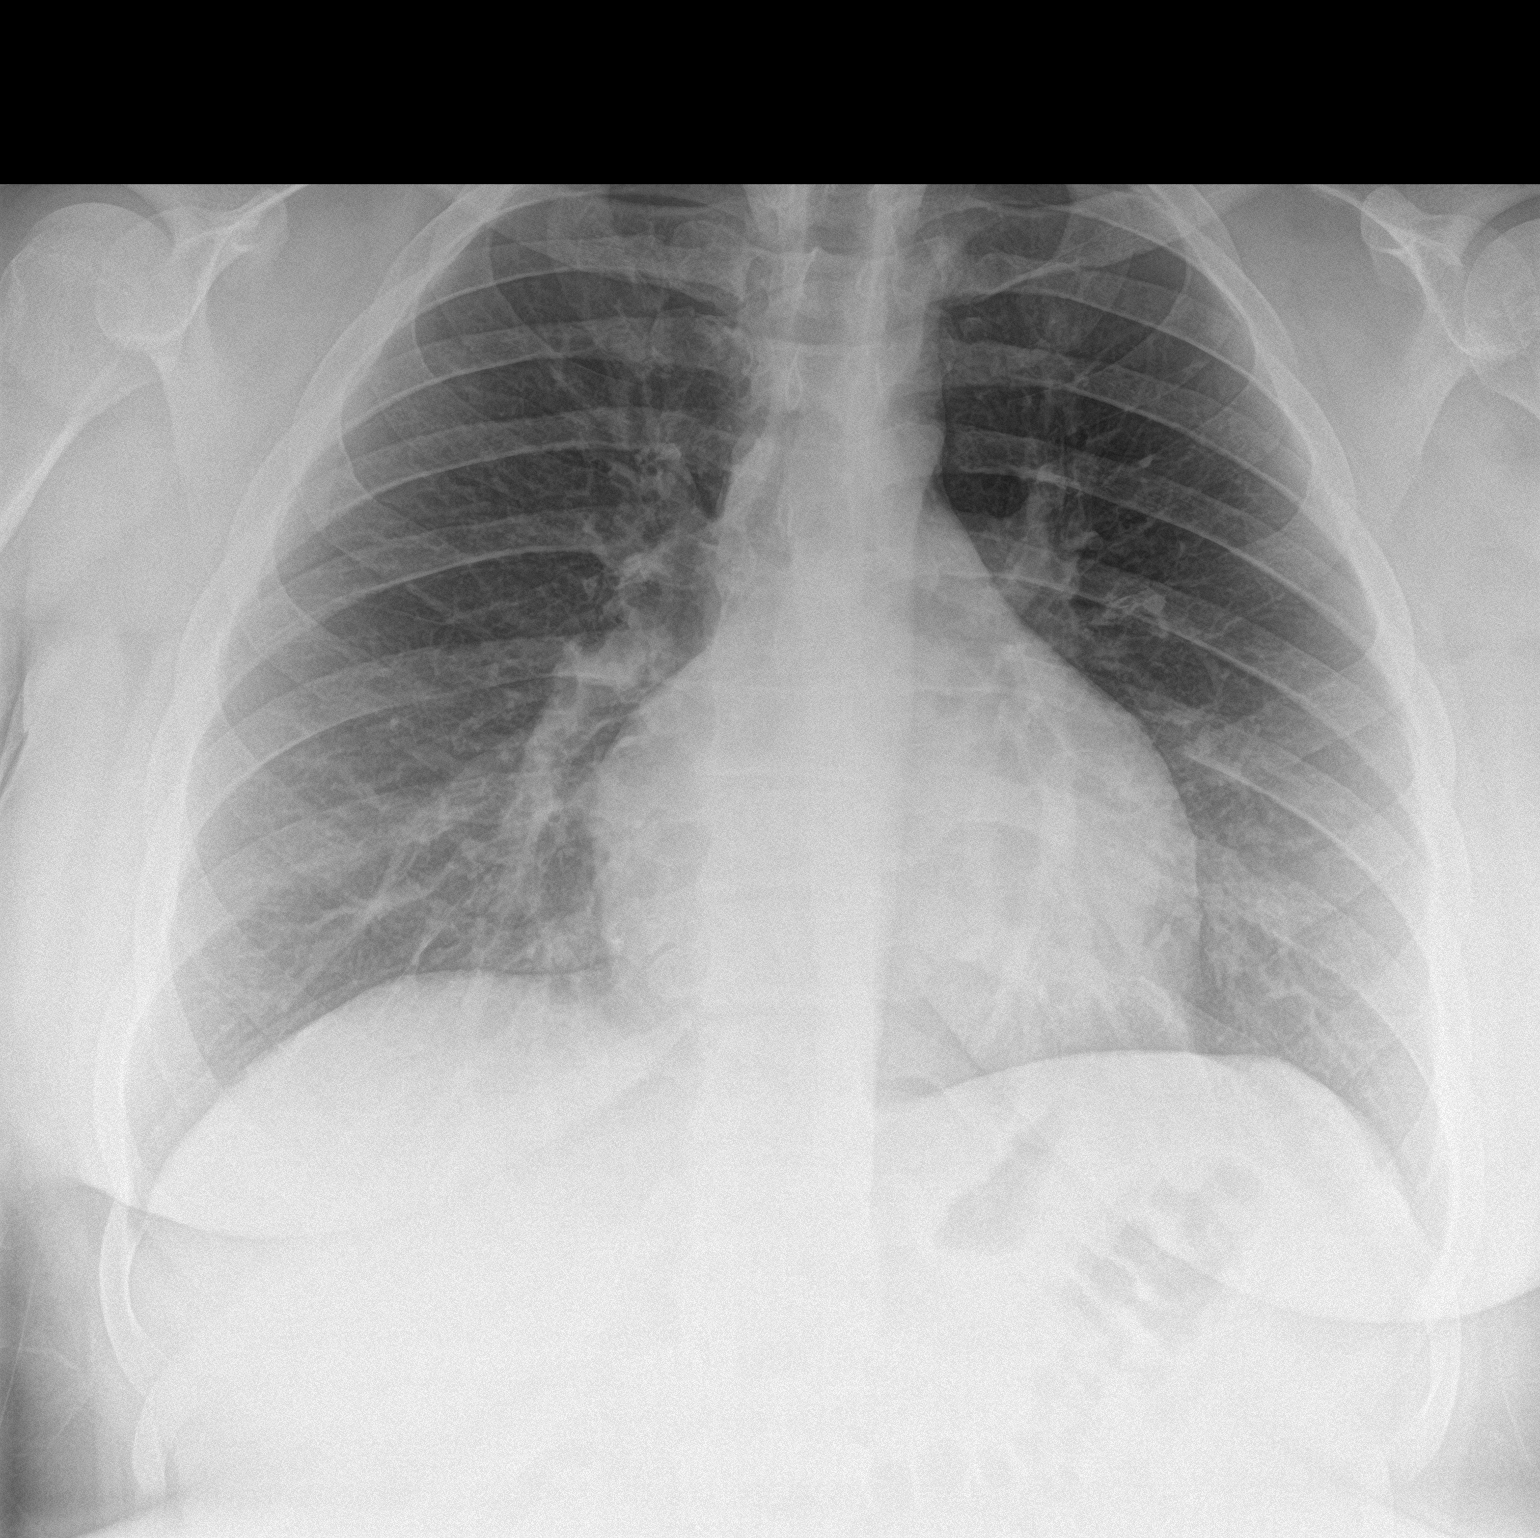

[chest lat]
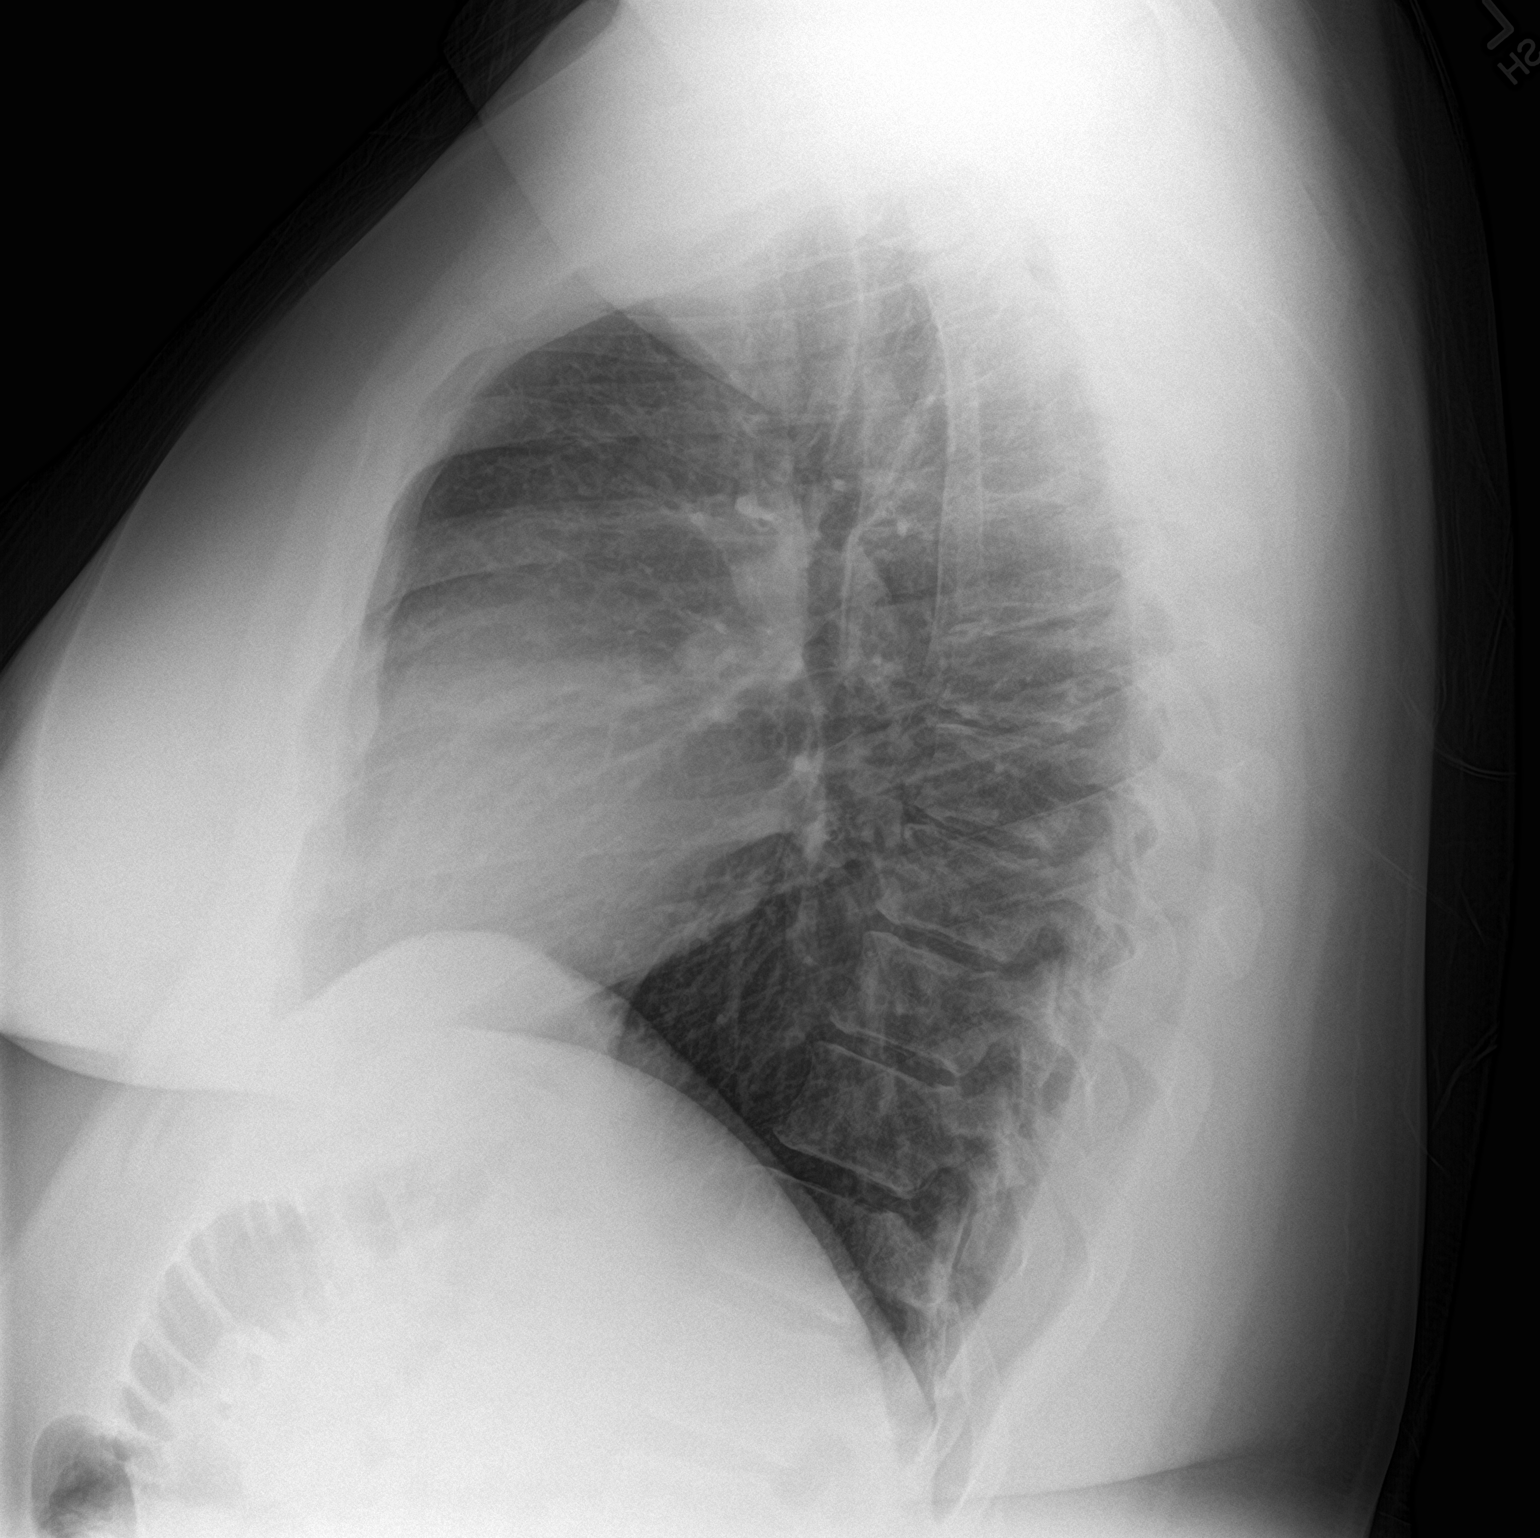

[2 of 2 positions shown; findings below may reference images not displayed]

FINDINGS: Lungs are clear. Heart size and pulmonary vascularity are normal. No
adenopathy. No bone lesions.
IMPRESSION: No edema or consolidation.

## 2020-06-30 ENCOUNTER — Other Ambulatory Visit: Payer: Self-pay | Admitting: Family Medicine

## 2020-07-18 ENCOUNTER — Ambulatory Visit (INDEPENDENT_AMBULATORY_CARE_PROVIDER_SITE_OTHER): Payer: Medicaid Other | Admitting: Sports Medicine

## 2020-07-18 ENCOUNTER — Other Ambulatory Visit: Payer: Self-pay

## 2020-07-18 ENCOUNTER — Encounter: Payer: Self-pay | Admitting: Sports Medicine

## 2020-07-18 DIAGNOSIS — M654 Radial styloid tenosynovitis [de Quervain]: Secondary | ICD-10-CM

## 2020-07-18 MED ORDER — MELOXICAM 15 MG PO TABS: ORAL_TABLET | ORAL | 3 refills | Status: DC

## 2020-07-18 NOTE — Progress Notes (Signed)
    Procedures performed today:    Procedure: Real-time Ultrasound Guided injection of the right first extensor compartment Device: Samsung HS60  Verbal informed consent obtained.  Time-out conducted.  Noted no overlying erythema, induration, or other signs of local infection.  Skin prepped in a sterile fashion.  Local anesthesia: Topical Ethyl chloride.  With sterile technique and under real time ultrasound guidance: 1 cc Kenalog 40, 1 cc lidocaine, 1 cc bupivacaine injected easily Completed without difficulty  Pain immediately resolved suggesting accurate placement of the medication.  Advised to call if fevers/chills, erythema, induration, drainage, or persistent bleeding.  Images permanently stored and available for review in the ultrasound unit.  Impression: Technically successful ultrasound guided injection.  Independent interpretation of notes and tests performed by another provider:   None.  Brief History, Exam, Impression, and Recommendations:    De Quervain's tenosynovitis, right This is a very pleasant 24 year old male, he has had pain for 3 months in his right wrist, radial aspect, seems to be related to the first extensor compartment consistent with de Quervain's tenosynovitis, he does play a lot of video games. Spica bracing was not helpful, neither was oral or topical NSAIDs. Today we injected his first extensor compartment, multiple adhesions were broken up. Adding home rehab exercises, switching to meloxicam, return to see me in a month to 5 weeks.    ___________________________________________ Ihor Austin. Benjamin Stain, M.D., ABFM., CAQSM. Primary Care and Sports Medicine [facility]  Adjunct Instructor of Family Medicine  [employer]

## 2020-07-18 NOTE — Assessment & Plan Note (Signed)
This is a very pleasant 24 year old male, he has had pain for 3 months in his right wrist, radial aspect, seems to be related to the first extensor compartment consistent with de Quervain's tenosynovitis, he does play a lot of video games. Spica bracing was not helpful, neither was oral or topical NSAIDs. Today we injected his first extensor compartment, multiple adhesions were broken up. Adding home rehab exercises, switching to meloxicam, return to see me in a month to 5 weeks.

## 2020-08-26 ENCOUNTER — Ambulatory Visit (INDEPENDENT_AMBULATORY_CARE_PROVIDER_SITE_OTHER): Payer: Medicaid Other | Admitting: Sports Medicine

## 2020-08-26 ENCOUNTER — Other Ambulatory Visit: Payer: Self-pay

## 2020-08-26 DIAGNOSIS — M654 Radial styloid tenosynovitis [de Quervain]: Secondary | ICD-10-CM | POA: Diagnosis not present

## 2020-08-26 NOTE — Progress Notes (Signed)
    Procedures performed today:    None.  Independent interpretation of notes and tests performed by another provider:   None.  Brief History, Exam, Impression, and Recommendations:    Cody Harvey is a pleasant 24yo male who comes in for follow up on de quervains tenosynovitis of the right wrist. His pain has improved fully after injection. We have advised follow up as needed.   Aurelio Jew, MS3   ___________________________________________ Ihor Austin. Benjamin Stain, M.D., ABFM., CAQSM. Primary Care and Sports Medicine [facility]  Adjunct Instructor of Family Medicine  [employer]

## 2020-08-26 NOTE — Assessment & Plan Note (Signed)
Cody Harvey is a very pleasant 24 year old male, we injected his first extensor compartment at the last visit he returns today pain-free. Return as needed.

## 2020-09-30 ENCOUNTER — Telehealth (INDEPENDENT_AMBULATORY_CARE_PROVIDER_SITE_OTHER): Payer: Medicaid Other | Admitting: Physician Assistant

## 2020-09-30 ENCOUNTER — Encounter: Payer: Self-pay | Admitting: Physician Assistant

## 2020-09-30 VITALS — BP 135/60 | Temp 99.0°F | Wt 301.0 lb

## 2020-09-30 DIAGNOSIS — J069 Acute upper respiratory infection, unspecified: Secondary | ICD-10-CM

## 2020-09-30 DIAGNOSIS — F900 Attention-deficit hyperactivity disorder, predominantly inattentive type: Secondary | ICD-10-CM | POA: Diagnosis not present

## 2020-09-30 DIAGNOSIS — F5101 Primary insomnia: Secondary | ICD-10-CM | POA: Diagnosis not present

## 2020-09-30 MED ORDER — CLONIDINE HCL 0.2 MG PO TABS: 0.8000 mg | ORAL_TABLET | Freq: Every day | ORAL | 1 refills | Status: DC

## 2020-09-30 MED ORDER — AMPHETAMINE-DEXTROAMPHETAMINE 30 MG PO TABS: 30.0000 mg | ORAL_TABLET | Freq: Two times a day (BID) | ORAL | 0 refills | Status: DC

## 2020-09-30 MED ORDER — HYDROCOD POLST-CPM POLST ER 10-8 MG/5ML PO SUER: 5.0000 mL | Freq: Two times a day (BID) | ORAL | 0 refills | Status: DC | PRN

## 2020-09-30 NOTE — Progress Notes (Signed)
Spoke with mom, she reports that patient has had a cough that she relates to allergies. Denies fever, nausea, vomiting, diarrhea. She wants to know if patient can take mucinex with his other meds.  She would like him to have a cough medication to help him sleep.

## 2020-10-01 NOTE — Progress Notes (Signed)
Patient ID: Cody Harvey, male   DOB: January 30, 1996, 24 y.o.   MRN: [id number] .Marland KitchenVirtual Visit via Telephone Note  I connected with Cody Harvey on 09/30/2020 at  4:20 PM EDT by telephone and verified that I am speaking with the correct person using two identifiers.  Location: Patient: home Provider: clinic   I discussed the limitations, risks, security and privacy concerns of performing an evaluation and management service by telephone and the availability of in person appointments. I also discussed with the patient that there may be a patient responsible charge related to this service. The patient expressed understanding and agreed to proceed.   History of Present Illness: Pt is a 24 yo autistic obese male who calls into the clinic for medication refills and to discuss cough, congestion, runny nose, sneezing for 3 days. No covid contacts. Denies any loss of taste or smell, GI symptoms, fever, body aches, SOB or wheezing. History of fall allergies. Not taking anything OTC. Cough keeping up at night. Mother and sister have both been sick with 2 negative covid test. He wonders what OTC he can take. He is not taking allergy medication daily only benadryl as needed.   He has ADHD. Well controlled on Adderall. Needs refills. No problems or concerns.   Insomnia needs refills on clonidine. Does well with no issues.   .. 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     Observations/Objective: No acute distress.  No labored breathing.   .. Today's Vitals   09/30/20 1619  BP: 135/60  Temp: 99 F (37.2 C)  Weight: (!) 301 lb (136.5 kg)   Body mass index is 40.77 kg/m.    Assessment and Plan: Marland KitchenMarland KitchenDiagnoses and all orders for this visit:  Viral upper respiratory tract infection -     chlorpheniramine-HYDROcodone (TUSSIONEX) 10-8 MG/5ML SUER; Take 5 mLs by mouth every 12 (twelve) hours as needed.  Primary insomnia -     cloNIDine (CATAPRES) 0.2 MG tablet; Take 4 tablets (0.8 mg total) by mouth at bedtime.  Attention deficit hyperactivity disorder (ADHD), predominantly inattentive type -     amphetamine-dextroamphetamine (ADDERALL) 30 MG tablet; Take 1 tablet by mouth 2 (two) times daily.   I suspect more viral URI. Discussed symptomatic care. Ok to start mucinex and to take zyrtec or claritin during allergy seasons. Rest and hydrate. Cough syrup given as requested. Follow up with any new or worsening symptoms.   Medications refilled.   Follow up in 3 months.    Follow Up Instructions:    I discussed the assessment and treatment plan with the patient. The patient was provided an opportunity to ask questions and all were answered. The patient agreed with the plan and demonstrated an understanding of the instructions.   The patient was advised to call back or seek an in-person evaluation if the  symptoms worsen or if the condition fails to improve as anticipated.  I provided 20 minutes of non-face-to-face time during this encounter.   Tandy Gaw, PA-C

## 2020-10-15 ENCOUNTER — Encounter: Payer: Self-pay | Admitting: Physician Assistant

## 2020-10-15 ENCOUNTER — Telehealth (INDEPENDENT_AMBULATORY_CARE_PROVIDER_SITE_OTHER): Payer: Medicaid Other | Admitting: Physician Assistant

## 2020-10-15 VITALS — Ht 72.5 in | Wt 301.0 lb

## 2020-10-15 DIAGNOSIS — J329 Chronic sinusitis, unspecified: Secondary | ICD-10-CM

## 2020-10-15 DIAGNOSIS — J40 Bronchitis, not specified as acute or chronic: Secondary | ICD-10-CM

## 2020-10-15 MED ORDER — AZITHROMYCIN 250 MG PO TABS: ORAL_TABLET | ORAL | 0 refills | Status: DC

## 2020-10-15 NOTE — Progress Notes (Signed)
Patient ID: Cody Harvey, male   DOB: 09/22/1996, 24 y.o.   MRN: [id number] .Marland KitchenVirtual Visit via Telephone Note  I connected with Cody Harvey on 10/15/20 at 11:30 AM EDT by telephone and verified that I am speaking with the correct person using two identifiers.  Location: Patient: home Provider: clinic   I discussed the limitations, risks, security and privacy concerns of performing an evaluation and management service by telephone and the availability of in person appointments. I also discussed with the patient that there may be a patient responsible charge related to this service. The patient expressed understanding and agreed to proceed.   History of Present Illness: Pt is a 24 yo male who calls into the clinic with URI symptoms for the last 2 weeks. He was seen 10/18 and given cough syrup. He continues to worsen. He has worse cough and sinus pressure and ear pain. No fever, chills, body aches. He is more fatigued.    .. Active Ambulatory Problems    Diagnosis Date Noted  . 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 Insomnia 04/03/2010  . ADHD (attention deficit hyperactivity disorder) 11/24/2011  . Obesity 03/28/2015  . 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 History of impacted cerumen 02/11/2018  . GAD (generalized anxiety disorder) 02/08/2019  . Obsessive-compulsive behavior 02/08/2019  . Vitamin D deficiency 07/12/2019   Resolved Ambulatory Problems    Diagnosis Date Noted  . adhd 04/03/2010  . OBESITY, CLASS I 04/03/2010  . De Quervain's tenosynovitis, right 04/29/2020   Past Medical History:  Diagnosis Date  . ADD (attention deficit disorder)   . Sleep disturbance, unspecified    Reviewed med, allergies, problem list.   Observations/Objective: No acute distress Productive cough.  No vitals.    Assessment and Plan: .Marland KitchenSwaziland was seen today for cough.  Diagnoses and all orders for this visit:  Sinobronchitis -     azithromycin  (ZITHROMAX Z-PAK) 250 MG tablet; Take 2 tablets (500 mg) on  Day 1,  followed by 1 tablet (250 mg) once daily on Days 2 through 5.   Treated with zpak. He does have albuterol at home to use as needed. Continue to rest and hydrate. Ok to use flonase.    Follow Up Instructions:    I discussed the assessment and treatment plan with the patient. The patient was provided an opportunity to ask questions and all were answered. The patient agreed with the plan and demonstrated an understanding of the instructions.   The patient was advised to call back or seek an in-person evaluation if the symptoms worsen or if the condition fails to improve as anticipated.  I provided 15 minutes of non-face-to-face time during this encounter.   Tandy Gaw, PA-C

## 2020-10-15 NOTE — Progress Notes (Signed)
Seen 09/30/2020: I suspect more viral URI. Discussed symptomatic care. Ok to start mucinex and to take zyrtec or claritin during allergy seasons. Rest and hydrate. Cough syrup given as requested. (Tussionex) Follow up with any new or worsening symptoms.

## 2020-12-12 ENCOUNTER — Other Ambulatory Visit: Payer: Self-pay | Admitting: Family Medicine

## 2020-12-16 ENCOUNTER — Telehealth (INDEPENDENT_AMBULATORY_CARE_PROVIDER_SITE_OTHER): Payer: Medicaid Other | Admitting: Physician Assistant

## 2020-12-16 ENCOUNTER — Encounter: Payer: Self-pay | Admitting: Physician Assistant

## 2020-12-16 DIAGNOSIS — J069 Acute upper respiratory infection, unspecified: Secondary | ICD-10-CM | POA: Diagnosis not present

## 2020-12-16 MED ORDER — HYDROCOD POLST-CPM POLST ER 10-8 MG/5ML PO SUER: 5.0000 mL | Freq: Two times a day (BID) | ORAL | 0 refills | Status: DC | PRN

## 2020-12-16 NOTE — Progress Notes (Signed)
..  Virtual Visit via Telephone Note  I connected with Cody Harvey on 12/16/20 at 11:10 AM EST by telephone and verified that I am speaking with the correct person using two identifiers.  Location: Patient: home Provider: home  .Participating in visit:  Patient: Cody Beber Patients mother: Babette Relic Mariscal Provider: Tandy Gaw PA-C   I discussed the limitations, risks, security and privacy concerns of performing an evaluation and management service by telephone and the availability of in person appointments. I also discussed with the patient that there may be a patient responsible charge related to this service. The patient expressed understanding and agreed to proceed.   History of Present Illness: Pt is a 25 yo male with 4 days of cough, sinus pressure, nasal congestion, ear poping, cough. The whole family is sick with similar symptoms. Fever is low grade. He does feel a little achy. No problems breathing. Cough is keeping him up at night. No loss of smell or taste. He has not had covid vaccine. He is taking tylenol right now.   .. Active Ambulatory Problems    Diagnosis Date Noted  . 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 Insomnia 04/03/2010  . ADHD (attention deficit hyperactivity disorder) 11/24/2011  . Obesity 03/28/2015  . 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 History of impacted cerumen 02/11/2018  . GAD (generalized anxiety disorder) 02/08/2019  . Obsessive-compulsive behavior 02/08/2019  . Vitamin D deficiency 07/12/2019   Resolved Ambulatory Problems    Diagnosis Date Noted  . adhd 04/03/2010  . OBESITY, CLASS I 04/03/2010  . De Quervain's tenosynovitis, right 04/29/2020   Past Medical History:  Diagnosis Date  . ADD (attention deficit disorder)   . Sleep disturbance, unspecified    REviewed med, allergy, problem list.     Observations/Objective: No acute distress Dry cough Nasal congestion No problems breathing.    .. Today's Vitals   12/16/20 1045  Temp: (!) 100.4 F (38 C)  TempSrc: Oral   There is no height or weight on file to calculate BMI.   Assessment and Plan: Marland KitchenMarland KitchenDiagnoses and all orders for this visit:  Viral upper respiratory tract infection -     chlorpheniramine-HYDROcodone (TUSSIONEX) 10-8 MG/5ML SUER; Take 5 mLs by mouth every 12 (twelve) hours as needed.   Symptoms to sound viral. Would recommend covid and flu testing but not able to leave home due to snow and no one to drive them. Discussed symptomatic treatment with tylenol cold sinus severe, ibuprofen, flonase, hydration, rest and tussionex at patients request. Consider .Marland KitchenVitamin D3 5000 IU (125 mcg) daily Vitamin C 500 mg twice daily Zinc 50 to 75 mg daily For better viral immune support.  Call with any worsening symptoms or not improving.   Marland KitchenMarland KitchenPDMP reviewed during this encounter.    Follow Up Instructions:    I discussed the assessment and treatment plan with the patient. The patient was provided an opportunity to ask questions and all were answered. The patient agreed with the plan and demonstrated an understanding of the instructions.   The patient was advised to call back or seek an in-person evaluation if the symptoms worsen or if the condition fails to improve as anticipated.  I provided 10 minutes of non-face-to-face time during this encounter.   Tandy Gaw, PA-C

## 2021-03-11 ENCOUNTER — Ambulatory Visit: Payer: Medicaid Other | Admitting: Family Medicine

## 2021-03-13 ENCOUNTER — Ambulatory Visit (INDEPENDENT_AMBULATORY_CARE_PROVIDER_SITE_OTHER): Payer: Medicaid Other | Admitting: Family Medicine

## 2021-03-13 ENCOUNTER — Encounter: Payer: Self-pay | Admitting: Family Medicine

## 2021-03-13 ENCOUNTER — Other Ambulatory Visit: Payer: Self-pay

## 2021-03-13 DIAGNOSIS — J069 Acute upper respiratory infection, unspecified: Secondary | ICD-10-CM

## 2021-03-13 DIAGNOSIS — F5101 Primary insomnia: Secondary | ICD-10-CM

## 2021-03-13 DIAGNOSIS — J301 Allergic rhinitis due to pollen: Secondary | ICD-10-CM

## 2021-03-13 DIAGNOSIS — M654 Radial styloid tenosynovitis [de Quervain]: Secondary | ICD-10-CM

## 2021-03-13 DIAGNOSIS — J309 Allergic rhinitis, unspecified: Secondary | ICD-10-CM | POA: Insufficient documentation

## 2021-03-13 DIAGNOSIS — F900 Attention-deficit hyperactivity disorder, predominantly inattentive type: Secondary | ICD-10-CM | POA: Diagnosis not present

## 2021-03-13 MED ORDER — AMPHETAMINE-DEXTROAMPHETAMINE 30 MG PO TABS: 30.0000 mg | ORAL_TABLET | Freq: Two times a day (BID) | ORAL | 0 refills | Status: DC

## 2021-03-13 MED ORDER — CLONIDINE HCL 0.2 MG PO TABS: 0.8000 mg | ORAL_TABLET | Freq: Every day | ORAL | 1 refills | Status: DC

## 2021-03-13 MED ORDER — CETIRIZINE HCL 10 MG PO TABS: 10.0000 mg | ORAL_TABLET | Freq: Every day | ORAL | 11 refills | Status: DC

## 2021-03-13 MED ORDER — HYDROCOD POLST-CPM POLST ER 10-8 MG/5ML PO SUER: 5.0000 mL | Freq: Two times a day (BID) | ORAL | 0 refills | Status: DC | PRN

## 2021-03-13 MED ORDER — DICLOFENAC SODIUM 1 % EX GEL: CUTANEOUS | 1 refills | Status: DC

## 2021-03-13 MED ORDER — FLUTICASONE PROPIONATE 50 MCG/ACT NA SUSP: 2.0000 | Freq: Every day | NASAL | 6 refills | Status: DC

## 2021-03-13 MED ORDER — MELOXICAM 15 MG PO TABS: ORAL_TABLET | ORAL | 3 refills | Status: DC

## 2021-03-13 NOTE — Progress Notes (Signed)
Cody Harvey - 25 y.o. male MRN [id number]  Date of birth: 11/02/1996  Subjective Chief Complaint  Patient presents with  . Sinus Problem    HPI Cody Harvey is a 25 y.o. male here today with complaint of nasal congestion and post nasal drainage.  He is also following up on ADHD and insomnia.  He would also like a refill of meloxicam and diclofenac.  He is using these as needed for DeQuervain's.    He is doing well with adderall at current strength.  He denies side effects from medication.    He has had problems with rhinitis for a few months. Tried benadryl intermittently, some improvement.  Has had issues with allergies in the past.   No sinus pain, fever or chills.    ROS:  A comprehensive ROS was completed and negative except as noted per HPI  No Known Allergies  Past Medical History:  Diagnosis Date  . 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 ADD (attention deficit disorder)   . GAD (generalized anxiety disorder) 02/08/2019  . History of impacted cerumen 02/11/2018  . Obesity   . Obsessive-compulsive behavior 02/08/2019  . Sleep disturbance, unspecified     Past Surgical History:  Procedure Laterality Date  . TYMPANOSTOMY TUBE PLACEMENT      Social History   Socioeconomic History  . Marital status: Single    Spouse name: Not on file  . Number of children: Not on file  . Years of education: Not on file  . Highest education level: Not on file  Occupational History  . Not on file  Tobacco Use  . Smoking status: Never Smoker  . Smokeless tobacco: Never Used  Vaping Use  . Vaping Use: Never used  Substance and Sexual Activity  . Alcohol use: No  . Drug use: No  . Sexual activity: Not on file  Other Topics Concern  . Not on file  Social History Narrative  . Not on file   Social Determinants of Health   Financial Resource Strain: Not on file  Food Insecurity: Not on file  Transportation Needs: Not on file   Physical Activity: Not on file  Stress: Not on file  Social Connections: Not on file    Family History  Problem Relation Age of Onset  . Fibromyalgia Mother   . Diabetes Father     Health Maintenance  Topic Date Due  . Hepatitis C Screening  Never done  . COVID-19 Vaccine (1) Never done  . HIV Screening  Never done  . INFLUENZA VACCINE  07/14/2020  . TETANUS/TDAP  08/27/2029  . HPV VACCINES  Aged Out     ----------------------------------------------------------------------------------------------------------------------------------------------------------------------------------------------------------------- Physical Exam BP 112/69 (BP Location: Left Arm, Patient Position: Sitting, Cuff Size: Large)   Pulse 93   Temp 97.6 F (36.4 C)   Ht 6' 0.5" (1.842 m)   Wt (!) 308 lb (139.7 kg)   SpO2 93%   BMI 41.20 kg/m   Physical Exam Constitutional:      Appearance: Normal appearance.  Eyes:     General: No scleral icterus. Cardiovascular:     Rate and Rhythm: Normal rate and regular rhythm.  Pulmonary:     Effort: Pulmonary effort is normal.     Breath sounds: Normal breath sounds.  Musculoskeletal:     Cervical back: Neck supple.  Skin:    General: Skin is warm and dry.  Neurological:     General:  No focal deficit present.     Mental Status: He is alert.  Psychiatric:        Mood and Affect: Mood normal.        Behavior: Behavior normal.     ------------------------------------------------------------------------------------------------------------------------------------------------------------------------------------------------------------------- Assessment and Plan  ADHD (attention deficit hyperactivity disorder) Well controlled at this time with current dose of adderall.  Rx renewed.    Insomnia Taking clonidine for insomnia related to autism. This continues to work well at current strength.  BP stable. Continue at this time.    Allergic  rhinitis Start cetirizine and flonase daily.  Allergen avoidance discussed.  He will let me know if not improving with this.   Tenosynovitis, de Quervain He will continue meloxicam and voltaren gel as needed.  Rx renewed.     Meds ordered this encounter  Medications  . amphetamine-dextroamphetamine (ADDERALL) 30 MG tablet    Sig: Take 1 tablet by mouth 2 (two) times daily.    Dispense:  180 tablet    Refill:  0  . chlorpheniramine-HYDROcodone (TUSSIONEX) 10-8 MG/5ML SUER    Sig: Take 5 mLs by mouth every 12 (twelve) hours as needed.    Dispense:  115 mL    Refill:  0  . cloNIDine (CATAPRES) 0.2 MG tablet    Sig: Take 4 tablets (0.8 mg total) by mouth at bedtime.    Dispense:  360 tablet    Refill:  1  . diclofenac Sodium (VOLTAREN) 1 % GEL    Sig: APPLY 2 GRAMS TO AFFECTED AREA 4 TIMES A DAY    Dispense:  100 g    Refill:  1  . meloxicam (MOBIC) 15 MG tablet    Sig: One tab PO qAM with a meal for 2 weeks, then daily prn pain.    Dispense:  30 tablet    Refill:  3  . cetirizine (ZYRTEC) 10 MG tablet    Sig: Take 1 tablet (10 mg total) by mouth daily.    Dispense:  30 tablet    Refill:  11  . fluticasone (FLONASE) 50 MCG/ACT nasal spray    Sig: Place 2 sprays into both nostrils daily.    Dispense:  16 g    Refill:  6    No follow-ups on file.    This visit occurred during the SARS-CoV-2 public health emergency.  Safety protocols were in place, including screening questions prior to the visit, additional usage of staff PPE, and extensive cleaning of exam room while observing appropriate contact time as indicated for disinfecting solutions.

## 2021-03-13 NOTE — Assessment & Plan Note (Signed)
He will continue meloxicam and voltaren gel as needed.  Rx renewed.

## 2021-03-13 NOTE — Patient Instructions (Signed)

## 2021-03-13 NOTE — Assessment & Plan Note (Signed)
Taking clonidine for insomnia related to autism. This continues to work well at current strength.  BP stable. Continue at this time.

## 2021-03-13 NOTE — Assessment & Plan Note (Signed)
Well controlled at this time with current dose of adderall.  Rx renewed.

## 2021-03-13 NOTE — Assessment & Plan Note (Signed)
Start cetirizine and flonase daily.  Allergen avoidance discussed.  He will let me know if not improving with this.

## 2021-03-25 ENCOUNTER — Other Ambulatory Visit: Payer: Self-pay

## 2021-03-25 DIAGNOSIS — J069 Acute upper respiratory infection, unspecified: Secondary | ICD-10-CM

## 2021-03-25 MED ORDER — HYDROCOD POLST-CPM POLST ER 10-8 MG/5ML PO SUER
5.0000 mL | Freq: Two times a day (BID) | ORAL | 0 refills | Status: DC | PRN
Start: 2021-03-25 — End: 2021-06-05

## 2021-03-25 NOTE — Progress Notes (Signed)
Reordering Tussionex medication. Parent states patient never received from pharmacy.

## 2021-05-06 LAB — HM COLONOSCOPY

## 2021-06-05 ENCOUNTER — Encounter: Payer: Self-pay | Admitting: Family Medicine

## 2021-06-05 ENCOUNTER — Telehealth (INDEPENDENT_AMBULATORY_CARE_PROVIDER_SITE_OTHER): Payer: Medicaid Other | Admitting: Family Medicine

## 2021-06-05 DIAGNOSIS — F5101 Primary insomnia: Secondary | ICD-10-CM

## 2021-06-05 DIAGNOSIS — M654 Radial styloid tenosynovitis [de Quervain]: Secondary | ICD-10-CM

## 2021-06-05 DIAGNOSIS — J069 Acute upper respiratory infection, unspecified: Secondary | ICD-10-CM

## 2021-06-05 DIAGNOSIS — F900 Attention-deficit hyperactivity disorder, predominantly inattentive type: Secondary | ICD-10-CM

## 2021-06-05 MED ORDER — AMPHETAMINE-DEXTROAMPHETAMINE 30 MG PO TABS: 30.0000 mg | ORAL_TABLET | Freq: Two times a day (BID) | ORAL | 0 refills | Status: DC

## 2021-06-05 MED ORDER — DICLOFENAC SODIUM 1 % EX GEL: CUTANEOUS | 1 refills | Status: AC

## 2021-06-05 MED ORDER — MELOXICAM 15 MG PO TABS: ORAL_TABLET | ORAL | 3 refills | Status: DC

## 2021-06-05 MED ORDER — CLONIDINE HCL 0.2 MG PO TABS: 0.8000 mg | ORAL_TABLET | Freq: Every day | ORAL | 1 refills | Status: DC

## 2021-06-05 MED ORDER — HYDROCOD POLST-CPM POLST ER 10-8 MG/5ML PO SUER: 5.0000 mL | Freq: Two times a day (BID) | ORAL | 0 refills | Status: DC | PRN

## 2021-06-05 NOTE — Assessment & Plan Note (Signed)
Voltaren and meloxicam renewed.

## 2021-06-05 NOTE — Progress Notes (Signed)
Cody Lyerly - 25 y.o. male MRN [id number]  Date of birth: 27-Jul-1996   This visit type was conducted due to national recommendations for restrictions regarding the COVID-19 Pandemic (e.g. social distancing).  This format is felt to be most appropriate for this patient at this time.  All issues noted in this document were discussed and addressed.  No physical exam was performed (except for noted visual exam findings with Video Visits).  I discussed the limitations of evaluation and management by telemedicine and the availability of in person appointments. The patient expressed understanding and agreed to proceed.  I connected withNAME@ on 06/05/21 at 11:30 AM EDT by a video enabled telemedicine application and verified that I am speaking with the correct person using two identifiers.  Present at visit: Everrett Coombe, DO Cody Shur   Patient Location: Home [address] [place] [address]   Provider location:   [facility]  No chief complaint on file.   HPI  Cody Harvey is a 25 y.o. male who presents via Web designer for a telehealth visit today.  Mother reports that his cough has improved with nasal spray.  He does have some gagging at times when using this so he doesn't use all the time.  He does still use tussionex occasionally.   He needs renewals of medications today including adderall.  He continues to dow well with current dosing and denies side effects.  He is taking clonidine at night as well.  No symptoms of hypotension.      ROS:  A comprehensive ROS was completed and negative except as noted per HPI  Past Medical History:  Diagnosis Date   Active autistic disorder 04/03/2010   Qualifier: Diagnosis of  By: Thomos Lemons     ADD (attention deficit disorder)    GAD (generalized anxiety disorder) 02/08/2019   History of impacted cerumen 02/11/2018   Obesity    Obsessive-compulsive behavior 02/08/2019   Sleep disturbance, unspecified     Past  Surgical History:  Procedure Laterality Date   TYMPANOSTOMY TUBE PLACEMENT      Family History  Problem Relation Age of Onset   Fibromyalgia Mother    Diabetes Father     Social History   Socioeconomic History   Marital status: Single    Spouse name: Not on file   Number of children: Not on file   Years of education: Not on file   Highest education level: Not on file  Occupational History   Not on file  Tobacco Use   Smoking status: Never   Smokeless tobacco: Never  Vaping Use   Vaping Use: Never used  Substance and Sexual Activity   Alcohol use: No   Drug use: No   Sexual activity: Not on file  Other Topics Concern   Not on file  Social History Narrative   Not on file   Social Determinants of Health   Financial Resource Strain: Not on file  Food Insecurity: Not on file  Transportation Needs: Not on file  Physical Activity: Not on file  Stress: Not on file  Social Connections: Not on file  Intimate Partner Violence: Not on file     Current Outpatient Medications:    amphetamine-dextroamphetamine (ADDERALL) 30 MG tablet, Take 1 tablet by mouth 2 (two) times daily., Disp: 180 tablet, Rfl: 0   cetirizine (ZYRTEC) 10 MG tablet, Take 1 tablet (10 mg total) by mouth daily., Disp: 30 tablet, Rfl: 11   chlorpheniramine-HYDROcodone (TUSSIONEX) 10-8 MG/5ML SUER, Take 5  mLs by mouth every 12 (twelve) hours as needed., Disp: 115 mL, Rfl: 0   cloNIDine (CATAPRES) 0.2 MG tablet, Take 4 tablets (0.8 mg total) by mouth at bedtime., Disp: 360 tablet, Rfl: 1   diclofenac Sodium (VOLTAREN) 1 % GEL, APPLY 2 GRAMS TO AFFECTED AREA 4 TIMES A DAY, Disp: 100 g, Rfl: 1   diphenhydrAMINE (BENADRYL) 25 MG tablet, Take 25 mg by mouth every 6 (six) hours as needed., Disp: , Rfl:    fluticasone (FLONASE) 50 MCG/ACT nasal spray, Place 2 sprays into both nostrils daily., Disp: 16 g, Rfl: 6   meloxicam (MOBIC) 15 MG tablet, One tab PO qAM with a meal for 2 weeks, then daily prn pain., Disp: 30  tablet, Rfl: 3   MULTIPLE VITAMIN PO, Take 1 tablet by mouth daily., Disp: , Rfl:    VITAMIN D PO, Take 1 capsule by mouth daily., Disp: , Rfl:   EXAM:  VITALS per patient if applicable: Ht 6' 0.5" (1.842 m)   Wt (!) 308 lb (139.7 kg)   BMI 41.20 kg/m   GENERAL: alert, oriented, appears well and in no acute distress  HEENT: atraumatic, conjunttiva clear, no obvious abnormalities on inspection of external nose and ears  NECK: normal movements of the head and neck  LUNGS: on inspection no signs of respiratory distress, breathing rate appears normal, no obvious gross SOB, gasping or wheezing  CV: no obvious cyanosis  MS: moves all visible extremities without noticeable abnormality  PSYCH/NEURO: pleasant and cooperative, no obvious depression or anxiety, speech and thought processing grossly intact  ASSESSMENT AND PLAN:  Discussed the following assessment and plan:  ADHD (attention deficit hyperactivity disorder) Doing well with current strength of adderall.  Rx renewed.  PM clonidine renewed   Tenosynovitis, de Quervain Voltaren and meloxicam renewed.       I discussed the assessment and treatment plan with the patient. The patient was provided an opportunity to ask questions and all were answered. The patient agreed with the plan and demonstrated an understanding of the instructions.   The patient was advised to call back or seek an in-person evaluation if the symptoms worsen or if the condition fails to improve as anticipated.    Everrett Coombe, DO

## 2021-06-05 NOTE — Assessment & Plan Note (Signed)
Doing well with current strength of adderall.  Rx renewed.  PM clonidine renewed

## 2021-07-24 ENCOUNTER — Other Ambulatory Visit: Payer: Self-pay

## 2021-07-24 DIAGNOSIS — F900 Attention-deficit hyperactivity disorder, predominantly inattentive type: Secondary | ICD-10-CM

## 2021-07-24 DIAGNOSIS — M654 Radial styloid tenosynovitis [de Quervain]: Secondary | ICD-10-CM

## 2021-07-25 ENCOUNTER — Encounter: Payer: Self-pay | Admitting: Family Medicine

## 2021-07-25 MED ORDER — MELOXICAM 15 MG PO TABS: ORAL_TABLET | ORAL | 3 refills | Status: DC

## 2021-07-25 MED ORDER — AMPHETAMINE-DEXTROAMPHETAMINE 30 MG PO TABS
30.0000 mg | ORAL_TABLET | Freq: Two times a day (BID) | ORAL | 0 refills | Status: DC
Start: 2021-07-25 — End: 2021-09-23

## 2021-08-21 ENCOUNTER — Other Ambulatory Visit: Payer: Self-pay

## 2021-08-21 ENCOUNTER — Encounter: Payer: Self-pay | Admitting: Family Medicine

## 2021-08-21 ENCOUNTER — Telehealth (INDEPENDENT_AMBULATORY_CARE_PROVIDER_SITE_OTHER): Payer: Medicaid Other | Admitting: Family Medicine

## 2021-08-21 DIAGNOSIS — J209 Acute bronchitis, unspecified: Secondary | ICD-10-CM | POA: Diagnosis not present

## 2021-08-21 MED ORDER — HYDROCOD POLST-CPM POLST ER 10-8 MG/5ML PO SUER: 5.0000 mL | Freq: Two times a day (BID) | ORAL | 0 refills | Status: DC | PRN

## 2021-08-21 MED ORDER — ALBUTEROL SULFATE HFA 108 (90 BASE) MCG/ACT IN AERS: 2.0000 | INHALATION_SPRAY | Freq: Four times a day (QID) | RESPIRATORY_TRACT | 0 refills | Status: DC | PRN

## 2021-08-21 MED ORDER — PREDNISONE 20 MG PO TABS: 20.0000 mg | ORAL_TABLET | Freq: Two times a day (BID) | ORAL | 0 refills | Status: DC

## 2021-08-21 NOTE — Progress Notes (Signed)
Cody Harvey - 25 y.o. male MRN [id number]  Date of birth: [birth year]   This visit type was conducted due to national recommendations for restrictions regarding the COVID-19 Pandemic (e.g. social distancing).  This format is felt to be most appropriate for this patient at this time.  All issues noted in this document were discussed and addressed.  No physical exam was performed (except for noted visual exam findings with Video Visits).  I discussed the limitations of evaluation and management by telemedicine and the availability of in person appointments. The patient expressed understanding and agreed to proceed.  I connected withNAME@ on 08/21/21 at  2:50 PM EDT by a video enabled telemedicine application and verified that I am speaking with the correct person using two identifiers.  Present at visit: Everrett Coombe, DO Cody Harvey   Patient Location: Home [address] [place] [address]   Provider location:   [facility]  Chief Complaint  Patient presents with   Cough   Generalized Body Aches   Nasal Congestion    HPI  Cody Harvey is a 25 y.o. male who presents via audio/video conferencing for a telehealth visit today.  He has had congestion, fatigue, cough with mild wheezing for approximately 3 days.  He has not had difficulty breathing, nausea, vomiting, diarrhea body aches.  No fevers been noted.  He has not really tried anything for management of symptoms at home.  He is drinking plenty of fluids but appetite is diminished some.  He has taken home COVID test which was negative.   ROS:  A comprehensive ROS was completed and negative except as noted per HPI  Past Medical History:  Diagnosis Date   Active autistic disorder 04/03/2010   Qualifier: Diagnosis of  By: Thomos Lemons     ADD (attention deficit disorder)    GAD (generalized anxiety disorder) 02/08/2019   History of impacted cerumen 02/11/2018   Obesity    Obsessive-compulsive behavior 02/08/2019   Sleep  disturbance, unspecified     Past Surgical History:  Procedure Laterality Date   TYMPANOSTOMY TUBE PLACEMENT      Family History  Problem Relation Age of Onset   Fibromyalgia Mother    Diabetes Father     Social History   Socioeconomic History   Marital status: Single    Spouse name: Not on file   Number of children: Not on file   Years of education: Not on file   Highest education level: Not on file  Occupational History   Not on file  Tobacco Use   Smoking status: Never   Smokeless tobacco: Never  Vaping Use   Vaping Use: Never used  Substance and Sexual Activity   Alcohol use: No   Drug use: No   Sexual activity: Not on file  Other Topics Concern   Not on file  Social History Narrative   Not on file   Social Determinants of Health   Financial Resource Strain: Not on file  Food Insecurity: Not on file  Transportation Needs: Not on file  Physical Activity: Not on file  Stress: Not on file  Social Connections: Not on file  Intimate Partner Violence: Not on file     Current Outpatient Medications:    albuterol (VENTOLIN HFA) 108 (90 Base) MCG/ACT inhaler, Inhale 2 puffs into the lungs every 6 (six) hours as needed for wheezing or shortness of breath., Disp: 8 g, Rfl: 0   amphetamine-dextroamphetamine (ADDERALL) 30 MG tablet, Take 1 tablet by mouth 2 (  two) times daily., Disp: 60 tablet, Rfl: 0   cetirizine (ZYRTEC) 10 MG tablet, Take 1 tablet (10 mg total) by mouth daily., Disp: 30 tablet, Rfl: 11   chlorpheniramine-HYDROcodone (TUSSIONEX PENNKINETIC ER) 10-8 MG/5ML SUER, Take 5 mLs by mouth every 12 (twelve) hours as needed for cough., Disp: 115 mL, Rfl: 0   cloNIDine (CATAPRES) 0.2 MG tablet, Take 4 tablets (0.8 mg total) by mouth at bedtime., Disp: 360 tablet, Rfl: 1   diclofenac Sodium (VOLTAREN) 1 % GEL, APPLY 2 GRAMS TO AFFECTED AREA 4 TIMES A DAY, Disp: 100 g, Rfl: 1   diphenhydrAMINE (BENADRYL) 25 MG tablet, Take 25 mg by mouth every 6 (six) hours as  needed., Disp: , Rfl:    fluticasone (FLONASE) 50 MCG/ACT nasal spray, Place 2 sprays into both nostrils daily., Disp: 16 g, Rfl: 6   meloxicam (MOBIC) 15 MG tablet, One tab PO qAM with a meal for 2 weeks, then daily prn pain., Disp: 30 tablet, Rfl: 3   MULTIPLE VITAMIN PO, Take 1 tablet by mouth daily., Disp: , Rfl:    predniSONE (DELTASONE) 20 MG tablet, Take 1 tablet (20 mg total) by mouth 2 (two) times daily with a meal., Disp: 10 tablet, Rfl: 0   VITAMIN D PO, Take 1 capsule by mouth daily., Disp: , Rfl:   EXAM:  VITALS per patient if applicable: BP (!) 135/50   Wt (!) 312 lb (141.5 kg)   BMI 41.73 kg/m   GENERAL: alert, oriented, appears well and in no acute distress  HEENT: atraumatic, conjunttiva clear, no obvious abnormalities on inspection of external nose and ears  NECK: normal movements of the head and neck  LUNGS: on inspection no signs of respiratory distress, breathing rate appears normal, no obvious gross SOB, gasping or wheezing  CV: no obvious cyanosis  MS: moves all visible extremities without noticeable abnormality  PSYCH/NEURO: pleasant and cooperative, no obvious depression or anxiety, speech and thought processing grossly intact  ASSESSMENT AND PLAN:  Discussed the following assessment and plan:  Acute bronchitis Symptoms are likely viral in nature.  Starting albuterol inhaler with short burst of prednisone.  Prescription for Tussionex refilled.  Instructed to repeat COVID test and contact clinic if this is positive.  We also discussed that if symptoms are worsening significantly to contact clinic or seek emergency care.     I discussed the assessment and treatment plan with the patient. The patient was provided an opportunity to ask questions and all were answered. The patient agreed with the plan and demonstrated an understanding of the instructions.   The patient was advised to call back or seek an in-person evaluation if the symptoms worsen or if the  condition fails to improve as anticipated.    Everrett Coombe, DO

## 2021-08-21 NOTE — Assessment & Plan Note (Signed)
Symptoms are likely viral in nature.  Starting albuterol inhaler with short burst of prednisone.  Prescription for Tussionex refilled.  Instructed to repeat COVID test and contact clinic if this is positive.  We also discussed that if symptoms are worsening significantly to contact clinic or seek emergency care.

## 2021-09-02 ENCOUNTER — Other Ambulatory Visit: Payer: Self-pay | Admitting: Family Medicine

## 2021-09-02 ENCOUNTER — Telehealth: Payer: Self-pay

## 2021-09-02 MED ORDER — AMOXICILLIN-POT CLAVULANATE 875-125 MG PO TABS: 1.0000 | ORAL_TABLET | Freq: Two times a day (BID) | ORAL | 0 refills | Status: DC

## 2021-09-02 NOTE — Telephone Encounter (Signed)
Pt's mother called stating that pt still has a bad cough, congestion and earache.  She states that Dr. Ashley Royalty would be willing to call in RX if symptoms persistent.  Pt's mother is requesting RXs for abx, cough medication and possible prednisone if Dr. Ashley Royalty thinks this is appropriate.  Tiajuana Amass, CMA  09/03/2021  Pt's mother informed of RX.  Tiajuana Amass, CMA

## 2021-09-02 NOTE — Telephone Encounter (Signed)
Prednisone and cough syrup were recently sent at last visit.  I will send antibiotic in.

## 2021-09-03 ENCOUNTER — Other Ambulatory Visit: Payer: Self-pay | Admitting: Family Medicine

## 2021-09-03 MED ORDER — HYDROCOD POLST-CPM POLST ER 10-8 MG/5ML PO SUER: 5.0000 mL | Freq: Two times a day (BID) | ORAL | 0 refills | Status: DC | PRN

## 2021-09-14 ENCOUNTER — Other Ambulatory Visit: Payer: Self-pay | Admitting: Family Medicine

## 2021-09-23 ENCOUNTER — Other Ambulatory Visit: Payer: Self-pay

## 2021-09-23 DIAGNOSIS — F900 Attention-deficit hyperactivity disorder, predominantly inattentive type: Secondary | ICD-10-CM

## 2021-09-23 MED ORDER — AMPHETAMINE-DEXTROAMPHETAMINE 30 MG PO TABS: 30.0000 mg | ORAL_TABLET | Freq: Two times a day (BID) | ORAL | 0 refills | Status: DC

## 2021-10-30 ENCOUNTER — Other Ambulatory Visit: Payer: Self-pay

## 2021-10-30 DIAGNOSIS — F900 Attention-deficit hyperactivity disorder, predominantly inattentive type: Secondary | ICD-10-CM

## 2021-10-30 NOTE — Telephone Encounter (Signed)
Patient's mother called requesting med refill for adderall. Rx pended.

## 2021-10-31 MED ORDER — AMPHETAMINE-DEXTROAMPHETAMINE 30 MG PO TABS: 30.0000 mg | ORAL_TABLET | Freq: Two times a day (BID) | ORAL | 0 refills | Status: DC

## 2021-10-31 NOTE — Telephone Encounter (Signed)
Task completed. Patient's mother Cody Harvey has been updated that refill request was approved/completed. No other inquiries during the call.

## 2021-11-18 ENCOUNTER — Other Ambulatory Visit: Payer: Self-pay | Admitting: Family Medicine

## 2021-11-28 ENCOUNTER — Other Ambulatory Visit: Payer: Self-pay | Admitting: Family Medicine

## 2021-11-28 DIAGNOSIS — F5101 Primary insomnia: Secondary | ICD-10-CM

## 2021-11-28 NOTE — Telephone Encounter (Signed)
1 mo supply sent. Needs appt before next refill

## 2021-11-28 NOTE — Telephone Encounter (Signed)
Patient has been scheduled with Dr Ashley Royalty on 12/19/2021.

## 2021-11-28 NOTE — Telephone Encounter (Signed)
Please call pt to schedule appt.  No further refills until pt is seen.  T. Blane Worthington, CMA  

## 2021-12-01 ENCOUNTER — Telehealth (INDEPENDENT_AMBULATORY_CARE_PROVIDER_SITE_OTHER): Payer: Medicaid Other | Admitting: Family Medicine

## 2021-12-01 ENCOUNTER — Encounter: Payer: Self-pay | Admitting: Family Medicine

## 2021-12-01 VITALS — HR 97 | Temp 100.3°F | Wt 306.0 lb

## 2021-12-01 DIAGNOSIS — J069 Acute upper respiratory infection, unspecified: Secondary | ICD-10-CM

## 2021-12-01 MED ORDER — HYDROCOD POLST-CPM POLST ER 10-8 MG/5ML PO SUER: 5.0000 mL | Freq: Two times a day (BID) | ORAL | 0 refills | Status: AC | PRN

## 2021-12-01 MED ORDER — PREDNISONE 20 MG PO TABS: 20.0000 mg | ORAL_TABLET | Freq: Two times a day (BID) | ORAL | 0 refills | Status: DC

## 2021-12-01 NOTE — Progress Notes (Signed)
Virtual Visit via Telephone Note  I connected with  Cody Harvey on 12/01/21 at  3:00 PM EST by telephone and verified that I am speaking with the correct person using two identifiers.   I discussed the limitations, risks, security and privacy concerns of performing an evaluation and management service by telephone and the availability of in person appointments. I also discussed with the patient that there may be a patient responsible charge related to this service. The patient expressed understanding and agreed to proceed.  Participating parties included in this telephone visit include: The patient and the nurse practitioner listed and his mom.  The patient is: At home I am: In the office  Subjective:    CC: cough   HPI: Cody Harvey is a 25 y.o. year old male presenting today via telephone visit to discuss cough.  Mom on call with patient to help since he is autistic. States that he has had a cough, rhinorrhea, and sore throat for the past 5 days. His cough is occasionally productive and will make him gag at times. His highest temps was 100.59F and comes down with motrin. He does get dyspneic and wheezy with exertion. Mom states that he does seem slightly more fatigued and have a poor appetite compared to baseline. He has not complained of any pain, nausea, vomiting, diarrhea. He is having trouble sleeping due to cough - lack of sleep causes problems with his routine and is very significant to him.     Past medical history, Surgical history, Family history not pertinant except as noted below, Social history, Allergies, and medications have been entered into the medical record, reviewed, and corrections made.   Review of Systems:  All review of systems negative except what is listed in the HPI  Objective:    General:  Patient speaking clearly in complete sentences. No shortness of breath noted.   Alert and oriented x3.   Normal judgment.  No apparent acute distress.  Impression  and Recommendations:    1. Viral URI with cough - chlorpheniramine-HYDROcodone (TUSSIONEX PENNKINETIC ER) 10-8 MG/5ML SUER; Take 5 mLs by mouth every 12 (twelve) hours as needed for up to 5 days for cough.  Dispense: 50 mL; Refill: 0 - predniSONE (DELTASONE) 20 MG tablet; Take 1 tablet (20 mg total) by mouth 2 (two) times daily with a meal.  Dispense: 10 tablet; Refill: 0  Treating as viral given duration of symptoms. He is having some occasional wheezing and dyspnea so will had a short prednisone burst and cough syrup as his cough has been keeping him up at night. Continue supportive measures including rest, hydration, humidifier use, steam showers, warm compresses to sinuses, warm liquids with lemon and honey, and over-the-counter cough, cold, and analgesics as needed.    Instructed to follow-up before the weekend if not improving or sooner if symptoms worsen. Patient aware of signs/symptoms requiring further/urgent evaluation.    I discussed the assessment and treatment plan with the patient. The patient was provided an opportunity to ask questions and all were answered. The patient agreed with the plan and demonstrated an understanding of the instructions.   The patient was advised to call back or seek an in-person evaluation if the symptoms worsen or if the condition fails to improve as anticipated.  I provided 20 minutes of non-face-to-face time during this TELEPHONE encounter.    Clayborne Dana, NP

## 2021-12-02 ENCOUNTER — Other Ambulatory Visit: Payer: Self-pay | Admitting: Family Medicine

## 2021-12-02 DIAGNOSIS — F900 Attention-deficit hyperactivity disorder, predominantly inattentive type: Secondary | ICD-10-CM

## 2021-12-02 MED ORDER — AMPHETAMINE-DEXTROAMPHETAMINE 30 MG PO TABS
30.0000 mg | ORAL_TABLET | Freq: Two times a day (BID) | ORAL | 0 refills | Status: DC
Start: 2021-12-02 — End: 2021-12-19

## 2021-12-02 NOTE — Telephone Encounter (Signed)
Spoke to pt's mother. Dr. Ashley Royalty has agreed to refill Adderall until 12/19/2021 appt. She is aware no further refills will be given without an in-person appt with Dr. Ashley Royalty.

## 2021-12-04 ENCOUNTER — Telehealth: Payer: Self-pay

## 2021-12-04 NOTE — Telephone Encounter (Signed)
Pt's mother called and said pt got a notice for jury duty. The date for jury duty is Jan 6th, the same day he has his appointment with you. Pt's mother is asking for an letter excusing pt from jury duty due to his autism and other medical conditions.  Thanks

## 2021-12-05 ENCOUNTER — Encounter: Payer: Self-pay | Admitting: Family Medicine

## 2021-12-05 NOTE — Telephone Encounter (Signed)
Letter completed and routed to clinical pool to be printed and have patient pick up.

## 2021-12-09 ENCOUNTER — Telehealth: Payer: Self-pay

## 2021-12-09 NOTE — Telephone Encounter (Signed)
Dad left msg on 12/23 requesting a letter excusing Cody Harvey from [employer]. Sent text link to dad's number to set up mychart so they can access letter once it is written.

## 2021-12-09 NOTE — Telephone Encounter (Signed)
Mother informed that letter is ready.  Mother requested letter be mailed to address on file.  Tiajuana Amass, CMA

## 2021-12-16 NOTE — Telephone Encounter (Signed)
Letter has been printed and left in the accordian.   LVM for patient to pickup in the morning.

## 2021-12-19 ENCOUNTER — Encounter: Payer: Self-pay | Admitting: Family Medicine

## 2021-12-19 ENCOUNTER — Ambulatory Visit (INDEPENDENT_AMBULATORY_CARE_PROVIDER_SITE_OTHER): Payer: Medicaid Other | Admitting: Family Medicine

## 2021-12-19 ENCOUNTER — Other Ambulatory Visit: Payer: Self-pay

## 2021-12-19 VITALS — BP 120/57 | HR 75 | Temp 97.8°F | Ht 72.5 in | Wt 312.0 lb

## 2021-12-19 DIAGNOSIS — Z23 Encounter for immunization: Secondary | ICD-10-CM | POA: Diagnosis not present

## 2021-12-19 DIAGNOSIS — F5101 Primary insomnia: Secondary | ICD-10-CM | POA: Diagnosis not present

## 2021-12-19 DIAGNOSIS — F900 Attention-deficit hyperactivity disorder, predominantly inattentive type: Secondary | ICD-10-CM

## 2021-12-19 DIAGNOSIS — E6609 Other obesity due to excess calories: Secondary | ICD-10-CM

## 2021-12-19 DIAGNOSIS — Z13220 Encounter for screening for lipoid disorders: Secondary | ICD-10-CM

## 2021-12-19 DIAGNOSIS — Z6839 Body mass index (BMI) 39.0-39.9, adult: Secondary | ICD-10-CM

## 2021-12-19 DIAGNOSIS — E559 Vitamin D deficiency, unspecified: Secondary | ICD-10-CM

## 2021-12-19 MED ORDER — CLONIDINE HCL 0.2 MG PO TABS: 0.8000 mg | ORAL_TABLET | Freq: Every day | ORAL | 3 refills | Status: DC

## 2021-12-19 MED ORDER — AMPHETAMINE-DEXTROAMPHETAMINE 30 MG PO TABS: 30.0000 mg | ORAL_TABLET | Freq: Two times a day (BID) | ORAL | 0 refills | Status: DC

## 2021-12-20 LAB — COMPLETE METABOLIC PANEL WITH GFR
AG Ratio: 1.8 (calc) (ref 1.0–2.5)
ALT: 15 U/L (ref 9–46)
AST: 14 U/L (ref 10–40)
Albumin: 4.2 g/dL (ref 3.6–5.1)
Alkaline phosphatase (APISO): 60 U/L (ref 36–130)
BUN: 18 mg/dL (ref 7–25)
CO2: 24 mmol/L (ref 20–32)
Calcium: 9.5 mg/dL (ref 8.6–10.3)
Chloride: 104 mmol/L (ref 98–110)
Creat: 0.9 mg/dL (ref 0.60–1.24)
Globulin: 2.4 g/dL (calc) (ref 1.9–3.7)
Glucose, Bld: 95 mg/dL (ref 65–99)
Potassium: 3.9 mmol/L (ref 3.5–5.3)
Sodium: 140 mmol/L (ref 135–146)
Total Bilirubin: 0.4 mg/dL (ref 0.2–1.2)
Total Protein: 6.6 g/dL (ref 6.1–8.1)
eGFR: 120 mL/min/{1.73_m2} (ref >=60)

## 2021-12-20 LAB — CBC WITH DIFFERENTIAL/PLATELET
Absolute Monocytes: 631 cells/uL (ref 200–950)
Basophils Absolute: 29 cells/uL (ref 0–200)
Basophils Relative: 0.3 %
Eosinophils Absolute: 281 cells/uL (ref 15–500)
Eosinophils Relative: 2.9 %
HCT: 44.8 % (ref 38.5–50.0)
Hemoglobin: 14.9 g/dL (ref 13.2–17.1)
Lymphs Abs: 2241 cells/uL (ref 850–3900)
MCH: 27.4 pg (ref 27.0–33.0)
MCHC: 33.3 g/dL (ref 32.0–36.0)
MCV: 82.5 fL (ref 80.0–100.0)
MPV: 9.3 fL (ref 7.5–12.5)
Monocytes Relative: 6.5 %
Neutro Abs: 6518 cells/uL (ref 1500–7800)
Neutrophils Relative %: 67.2 %
Platelets: 228 10*3/uL (ref 140–400)
RBC: 5.43 10*6/uL (ref 4.20–5.80)
RDW: 12.5 % (ref 11.0–15.0)
Total Lymphocyte: 23.1 %
WBC: 9.7 10*3/uL (ref 3.8–10.8)

## 2021-12-20 LAB — LIPID PANEL W/REFLEX DIRECT LDL
Cholesterol: 201 mg/dL — ABNORMAL HIGH (ref <200)
HDL: 34 mg/dL — ABNORMAL LOW (ref >=40)
LDL Cholesterol (Calc): 126 mg/dL (calc) — ABNORMAL HIGH
Non-HDL Cholesterol (Calc): 167 mg/dL (calc) — ABNORMAL HIGH (ref <130)
Total CHOL/HDL Ratio: 5.9 (calc) — ABNORMAL HIGH (ref <5.0)
Triglycerides: 268 mg/dL — ABNORMAL HIGH (ref <150)

## 2021-12-20 LAB — VITAMIN D 25 HYDROXY (VIT D DEFICIENCY, FRACTURES): Vit D, 25-Hydroxy: 45 ng/mL (ref 30–100)

## 2021-12-21 NOTE — Progress Notes (Signed)
Cody Harvey - 26 y.o. male MRN [id number]  Date of birth: 1996/04/08  Subjective Chief Complaint  Patient presents with   Hypertension    HPI Cody is a 26 year old male here today for follow-up visit.  He has history of autism spectrum disorder, ADHD and insomnia.  Reports he is doing well with current medications.  He does continue on clonidine as well in the evening for management of insomnia.  This is working well for him.  He has not noted any side effects from medications.  He will be moving to [facility] in the next couple of months.  He does plan to continue returning to follow-up with our clinic as his father will continue to work in this area.  ROS:  A comprehensive ROS was completed and negative except as noted per HPI  No Known Allergies  Past Medical History:  Diagnosis Date   Active autistic disorder 04/03/2010   Qualifier: Diagnosis of  By: Thomos Lemons     ADD (attention deficit disorder)    GAD (generalized anxiety disorder) 02/08/2019   History of impacted cerumen 02/11/2018   Obesity    Obsessive-compulsive behavior 02/08/2019   Sleep disturbance, unspecified     Past Surgical History:  Procedure Laterality Date   TYMPANOSTOMY TUBE PLACEMENT      Social History   Socioeconomic History   Marital status: Single    Spouse name: Not on file   Number of children: Not on file   Years of education: Not on file   Highest education level: Not on file  Occupational History   Not on file  Tobacco Use   Smoking status: Never   Smokeless tobacco: Never  Vaping Use   Vaping Use: Never used  Substance and Sexual Activity   Alcohol use: No   Drug use: No   Sexual activity: Not on file  Other Topics Concern   Not on file  Social History Narrative   Not on file   Social Determinants of Health   Financial Resource Strain: Not on file  Food Insecurity: Not on file  Transportation Needs: Not on file  Physical Activity: Not on file  Stress: Not on file   Social Connections: Not on file    Family History  Problem Relation Age of Onset   Fibromyalgia Mother    Diabetes Father     Health Maintenance  Topic Date Due   COVID-19 Vaccine (1) Never done   HIV Screening  Never done   Hepatitis C Screening  Never done   COLONOSCOPY (Pts 45-52yrs Insurance coverage will need to be confirmed)  05/06/2026   TETANUS/TDAP  08/27/2029   INFLUENZA VACCINE  Completed   Pneumococcal Vaccine 7-66 Years old  Aged Out   HPV VACCINES  Aged Out     ----------------------------------------------------------------------------------------------------------------------------------------------------------------------------------------------------------------- Physical Exam BP (!) 120/57 (BP Location: Left Arm, Patient Position: Sitting, Cuff Size: Large)    Pulse 75    Temp 97.8 F (36.6 C)    Ht 6' 0.5" (1.842 m)    Wt (!) 312 lb (141.5 kg)    SpO2 97%    BMI 41.73 kg/m   Physical Exam Constitutional:      Appearance: Normal appearance.  HENT:     Head: Normocephalic and atraumatic.  Neurological:     General: No focal deficit present.     Mental Status: He is alert.  Psychiatric:        Mood and Affect: Mood normal.  Behavior: Behavior normal.    ------------------------------------------------------------------------------------------------------------------------------------------------------------------------------------------------------------------- Assessment and Plan  ADHD (attention deficit hyperactivity disorder) Stable with Adderall at current strength.  Also taking clonidine in the evenings for associated insomnia.  These continue to work well for him.  We will continue.  Updated labs ordered today.  Meds ordered this encounter  Medications   cloNIDine (CATAPRES) 0.2 MG tablet    Sig: Take 4 tablets (0.8 mg total) by mouth at bedtime.    Dispense:  120 tablet    Refill:  3   amphetamine-dextroamphetamine (ADDERALL) 30  MG tablet    Sig: Take 1 tablet by mouth 2 (two) times daily.    Dispense:  60 tablet    Refill:  0    Return in about 6 months (around 06/18/2022) for ADD.    This visit occurred during the SARS-CoV-2 public health emergency.  Safety protocols were in place, including screening questions prior to the visit, additional usage of staff PPE, and extensive cleaning of exam room while observing appropriate contact time as indicated for disinfecting solutions.

## 2021-12-21 NOTE — Assessment & Plan Note (Signed)
Stable with Adderall at current strength.  Also taking clonidine in the evenings for associated insomnia.  These continue to work well for him.  We will continue.

## 2022-01-25 DIAGNOSIS — R1013 Epigastric pain: Secondary | ICD-10-CM | POA: Diagnosis not present

## 2022-01-25 DIAGNOSIS — R1012 Left upper quadrant pain: Secondary | ICD-10-CM | POA: Diagnosis not present

## 2022-02-11 ENCOUNTER — Encounter: Payer: Self-pay | Admitting: Family Medicine

## 2022-02-11 ENCOUNTER — Telehealth (INDEPENDENT_AMBULATORY_CARE_PROVIDER_SITE_OTHER): Payer: Medicaid Other | Admitting: Family Medicine

## 2022-02-11 DIAGNOSIS — R1013 Epigastric pain: Secondary | ICD-10-CM | POA: Diagnosis not present

## 2022-02-11 DIAGNOSIS — F900 Attention-deficit hyperactivity disorder, predominantly inattentive type: Secondary | ICD-10-CM | POA: Diagnosis not present

## 2022-02-11 MED ORDER — AMPHETAMINE-DEXTROAMPHETAMINE 30 MG PO TABS: 30.0000 mg | ORAL_TABLET | Freq: Two times a day (BID) | ORAL | 0 refills | Status: DC

## 2022-02-11 MED ORDER — OMEPRAZOLE 20 MG PO CPDR: 20.0000 mg | DELAYED_RELEASE_CAPSULE | Freq: Every day | ORAL | 0 refills | Status: DC

## 2022-02-11 NOTE — Assessment & Plan Note (Signed)
Remains well controlled with Adderall at current strength.  We will continue.  Updated prescription sent in. ?

## 2022-02-11 NOTE — Progress Notes (Deleted)
?Cody Harvey - 26 y.o. male MRN [id number]  Date of birth: 05-04-1996 ? ?Subjective ?Chief Complaint  ?Patient presents with  ? Follow-up  ? Gastroesophageal Reflux  ? ? ?HPI ? ?No Known Allergies ? ?Past Medical History:  ?Diagnosis Date  ? Active autistic disorder 04/03/2010  ? Qualifier: Diagnosis of  By: Thomos Lemons    ? ADD (attention deficit disorder)   ? GAD (generalized anxiety disorder) 02/08/2019  ? History of impacted cerumen 02/11/2018  ? Obesity   ? Obsessive-compulsive behavior 02/08/2019  ? Sleep disturbance, unspecified   ? ? ?Past Surgical History:  ?Procedure Laterality Date  ? TYMPANOSTOMY TUBE PLACEMENT    ? ? ?Social History  ? ?Socioeconomic History  ? Marital status: Single  ?  Spouse name: Not on file  ? Number of children: Not on file  ? Years of education: Not on file  ? Highest education level: Not on file  ?Occupational History  ? Not on file  ?Tobacco Use  ? Smoking status: Never  ? Smokeless tobacco: Never  ?Vaping Use  ? Vaping Use: Never used  ?Substance and Sexual Activity  ? Alcohol use: No  ? Drug use: No  ? Sexual activity: Not on file  ?Other Topics Concern  ? Not on file  ?Social History Narrative  ? Not on file  ? ?Social Determinants of Health  ? ?Financial Resource Strain: Not on file  ?Food Insecurity: Not on file  ?Transportation Needs: Not on file  ?Physical Activity: Not on file  ?Stress: Not on file  ?Social Connections: Not on file  ? ? ?Family History  ?Problem Relation Age of Onset  ? Fibromyalgia Mother   ? Diabetes Father   ? ? ?Health Maintenance  ?Topic Date Due  ? COVID-19 Vaccine (1) Never done  ? HIV Screening  Never done  ? Hepatitis C Screening  Never done  ? COLONOSCOPY (Pts 45-18yrs Insurance coverage will need to be confirmed)  05/06/2026  ? TETANUS/TDAP  08/27/2029  ? INFLUENZA VACCINE  Completed  ? HPV VACCINES  Aged Out   ? ? ? ?----------------------------------------------------------------------------------------------------------------------------------------------------------------------------------------------------------------- ?Physical Exam ?BP (!) 145/80   Ht 6\' 1"  (1.854 m)   Wt 292 lb (132.5 kg)   BMI 38.52 kg/m?  ? ?Physical Exam ? ?------------------------------------------------------------------------------------------------------------------------------------------------------------------------------------------------------------------- ?Assessment and Plan ? ?No problem-specific Assessment & Plan notes found for this encounter. ? ? ?Meds ordered this encounter  ?Medications  ? amphetamine-dextroamphetamine (ADDERALL) 30 MG tablet  ?  Sig: Take 1 tablet by mouth 2 (two) times daily.  ?  Dispense:  60 tablet  ?  Refill:  0  ? omeprazole (PRILOSEC) 20 MG capsule  ?  Sig: Take 1 capsule (20 mg total) by mouth daily.  ?  Dispense:  90 capsule  ?  Refill:  0  ? amphetamine-dextroamphetamine (ADDERALL) 30 MG tablet  ?  Sig: Take 1 tablet by mouth 2 (two) times daily. Fill in 30 days  ?  Dispense:  60 tablet  ?  Refill:  0  ? amphetamine-dextroamphetamine (ADDERALL) 30 MG tablet  ?  Sig: Take 1 tablet by mouth 2 (two) times daily. Fill in 60 days  ?  Dispense:  60 tablet  ?  Refill:  0  ? ? ?No follow-ups on file. ? ? ? ?This visit occurred during the SARS-CoV-2 public health emergency.  Safety protocols were in place, including screening questions prior to the visit, additional usage of staff PPE, and extensive cleaning of exam room while observing  appropriate contact time as indicated for disinfecting solutions.  ? ?

## 2022-02-11 NOTE — Assessment & Plan Note (Signed)
Possible ulcer or gastritis.  Improved with omeprazole.  Discussed avoiding meloxicam for now.  We will have him take omeprazole for the next 3 months. ?

## 2022-02-11 NOTE — Progress Notes (Signed)
?Cody Harvey - 26 y.o. male MRN [id number]  Date of birth: [birth year] ? ? ?This visit type was conducted due to national recommendations for restrictions regarding the COVID-19 Pandemic (e.g. social distancing).  This format is felt to be most appropriate for this patient at this time.  All issues noted in this document were discussed and addressed.  No physical exam was performed (except for noted visual exam findings with Video Visits).  I discussed the limitations of evaluation and management by telemedicine and the availability of in person appointments. The patient expressed understanding and agreed to proceed. ? ?I connected withNAME@ on 02/11/22 at  3:30 PM EST by a video enabled telemedicine application and verified that I am speaking with the correct person using two identifiers. ? ?Present at visit: ?Everrett Coombe, DO ?Cody Harvey ? ? ?Patient Location: [facility] ?[address] ?[place] [address]  ? ?Provider location:   ?[facility] ? ?Chief Complaint  ?Patient presents with  ? Follow-up  ? Gastroesophageal Reflux  ? ? ?HPI ? ?Cody Harvey is a 26 y.o. male who presents via Web designer for a telehealth visit today.  Recently seen in the emergency room due to increased epigastric pain with nausea.  Diagnosed with gastritis or possible ulcer.  He was prescribed omeprazole.  He has had significant improvement since restarting this but was only given a 2-week course.  He is currently out of this medication and has had some increasing symptoms again.  He did vomit a small amount of blood previously.  He has not had any dark or tarry stools. ? ?ADHD remains well controlled with Adderall at current strength and dosing.  He does need refill of medication. ? ?ROS:  A comprehensive ROS was completed and negative except as noted per HPI ? ? ? ?ROS:  A comprehensive ROS was completed and negative except as noted per HPI ? ?Past Medical History:  ?Diagnosis Date  ? Active autistic disorder 04/03/2010  ?  Qualifier: Diagnosis of  By: Thomos Lemons    ? ADD (attention deficit disorder)   ? GAD (generalized anxiety disorder) 02/08/2019  ? History of impacted cerumen 02/11/2018  ? Obesity   ? Obsessive-compulsive behavior 02/08/2019  ? Sleep disturbance, unspecified   ? ? ?Past Surgical History:  ?Procedure Laterality Date  ? TYMPANOSTOMY TUBE PLACEMENT    ? ? ?Family History  ?Problem Relation Age of Onset  ? Fibromyalgia Mother   ? Diabetes Father   ? ? ?Social History  ? ?Socioeconomic History  ? Marital status: Single  ?  Spouse name: Not on file  ? Number of children: Not on file  ? Years of education: Not on file  ? Highest education level: Not on file  ?Occupational History  ? Not on file  ?Tobacco Use  ? Smoking status: Never  ? Smokeless tobacco: Never  ?Vaping Use  ? Vaping Use: Never used  ?Substance and Sexual Activity  ? Alcohol use: No  ? Drug use: No  ? Sexual activity: Not on file  ?Other Topics Concern  ? Not on file  ?Social History Narrative  ? Not on file  ? ?Social Determinants of Health  ? ?Financial Resource Strain: Not on file  ?Food Insecurity: Not on file  ?Transportation Needs: Not on file  ?Physical Activity: Not on file  ?Stress: Not on file  ?Social Connections: Not on file  ?Intimate Partner Violence: Not on file  ? ? ? ?Current Outpatient Medications:  ?  amphetamine-dextroamphetamine (ADDERALL) 30  MG tablet, Take 1 tablet by mouth 2 (two) times daily. Fill in 30 days, Disp: 60 tablet, Rfl: 0 ?  amphetamine-dextroamphetamine (ADDERALL) 30 MG tablet, Take 1 tablet by mouth 2 (two) times daily. Fill in 60 days, Disp: 60 tablet, Rfl: 0 ?  amphetamine-dextroamphetamine (ADDERALL) 30 MG tablet, Take 1 tablet by mouth 2 (two) times daily., Disp: 60 tablet, Rfl: 0 ?  cetirizine (ZYRTEC) 10 MG tablet, Take 1 tablet (10 mg total) by mouth daily., Disp: 30 tablet, Rfl: 11 ?  cloNIDine (CATAPRES) 0.2 MG tablet, Take 4 tablets (0.8 mg total) by mouth at bedtime., Disp: 120 tablet, Rfl: 3 ?  diclofenac  Sodium (VOLTAREN) 1 % GEL, APPLY 2 GRAMS TO AFFECTED AREA 4 TIMES A DAY, Disp: 100 g, Rfl: 1 ?  diphenhydrAMINE (BENADRYL) 25 MG tablet, Take 25 mg by mouth every 6 (six) hours as needed., Disp: , Rfl:  ?  meloxicam (MOBIC) 15 MG tablet, One tab PO qAM with a meal for 2 weeks, then daily prn pain., Disp: 30 tablet, Rfl: 3 ?  MULTIPLE VITAMIN PO, Take 1 tablet by mouth daily., Disp: , Rfl:  ?  omeprazole (PRILOSEC) 20 MG capsule, Take 1 capsule (20 mg total) by mouth daily., Disp: 90 capsule, Rfl: 0 ?  PROAIR HFA 108 (90 Base) MCG/ACT inhaler, TAKE 2 PUFFS BY MOUTH EVERY 6 HOURS AS NEEDED FOR WHEEZE OR SHORTNESS OF BREATH, Disp: 8.5 each, Rfl: 1 ?  VITAMIN D PO, Take 1 capsule by mouth daily., Disp: , Rfl:  ? ?EXAM: ? ?VITALS per patient if applicable: BP (!) 145/80   Ht 6\' 1"  (1.854 m)   Wt 292 lb (132.5 kg)   BMI 38.52 kg/m?  ? ?GENERAL: alert, oriented, appears well and in no acute distress ? ?HEENT: atraumatic, conjunttiva clear, no obvious abnormalities on inspection of external nose and ears ? ?NECK: normal movements of the head and neck ? ?LUNGS: on inspection no signs of respiratory distress, breathing rate appears normal, no obvious gross SOB, gasping or wheezing ? ?CV: no obvious cyanosis ? ?MS: moves all visible extremities without noticeable abnormality ? ?PSYCH/NEURO: pleasant and cooperative, no obvious depression or anxiety, speech and thought processing grossly intact ? ?ASSESSMENT AND PLAN: ? ?Discussed the following assessment and plan: ? ?ADHD (attention deficit hyperactivity disorder) ?Remains well controlled with Adderall at current strength.  We will continue.  Updated prescription sent in. ? ?Epigastric pain ?Possible ulcer or gastritis.  Improved with omeprazole.  Discussed avoiding meloxicam for now.  We will have him take omeprazole for the next 3 months. ? ? ?  ?I discussed the assessment and treatment plan with the patient. The patient was provided an opportunity to ask questions and  all were answered. The patient agreed with the plan and demonstrated an understanding of the instructions. ?  ?The patient was advised to call back or seek an in-person evaluation if the symptoms worsen or if the condition fails to improve as anticipated. ? ? ? ? , DO  ? ?

## 2022-03-15 ENCOUNTER — Other Ambulatory Visit: Payer: Self-pay | Admitting: Family Medicine

## 2022-03-15 DIAGNOSIS — F5101 Primary insomnia: Secondary | ICD-10-CM

## 2022-03-15 DIAGNOSIS — M654 Radial styloid tenosynovitis [de Quervain]: Secondary | ICD-10-CM

## 2022-05-01 ENCOUNTER — Other Ambulatory Visit: Payer: Self-pay

## 2022-05-01 DIAGNOSIS — F900 Attention-deficit hyperactivity disorder, predominantly inattentive type: Secondary | ICD-10-CM

## 2022-05-04 MED ORDER — AMPHETAMINE-DEXTROAMPHETAMINE 30 MG PO TABS: 30.0000 mg | ORAL_TABLET | Freq: Two times a day (BID) | ORAL | 0 refills | Status: DC

## 2022-06-18 ENCOUNTER — Telehealth: Payer: Self-pay | Admitting: Family Medicine

## 2022-06-24 ENCOUNTER — Other Ambulatory Visit: Payer: Self-pay

## 2022-06-24 DIAGNOSIS — F900 Attention-deficit hyperactivity disorder, predominantly inattentive type: Secondary | ICD-10-CM

## 2022-06-24 MED ORDER — AMPHETAMINE-DEXTROAMPHETAMINE 30 MG PO TABS: 30.0000 mg | ORAL_TABLET | Freq: Two times a day (BID) | ORAL | 0 refills | Status: AC

## 2023-11-10 ENCOUNTER — Ambulatory Visit: Payer: Self-pay | Admitting: Family Medicine

## 2023-11-10 NOTE — Progress Notes (Deleted)
   Rubin Payor, PhD, LAT, ATC acting as a scribe for Clementeen Graham, MD.  Cody Harvey is a 27 y.o. male who presents to [facility] today for ***. Pt locates pain to ***   Pertinent review of systems: ***  Relevant historical information: ***   Exam:  There were no vitals taken for this visit. General: Well Developed, well nourished, and in no acute distress.   MSK: ***    Lab and Radiology Results No results found for this or any previous visit (from the past 72 hour(s)). No results found.     Assessment and Plan: 27 y.o. male with ***   PDMP not reviewed this encounter. No orders of the defined types were placed in this encounter.  No orders of the defined types were placed in this encounter.    Discussed warning signs or symptoms. Please see discharge instructions. Patient expresses understanding.   ***
